# Patient Record
Sex: Female | Born: 1990 | Race: White | Hispanic: Yes | Marital: Married | Smoking: Former smoker
Health system: Southern US, Community
[De-identification: ages and names within clinical notes are randomized; demographics above are authoritative.]

## PROBLEM LIST (undated history)

## (undated) DIAGNOSIS — G8929 Other chronic pain: Secondary | ICD-10-CM

## (undated) DIAGNOSIS — F32A Depression, unspecified: Secondary | ICD-10-CM

## (undated) DIAGNOSIS — F419 Anxiety disorder, unspecified: Secondary | ICD-10-CM

## (undated) HISTORY — DX: Depression, unspecified: F32.A

## (undated) HISTORY — DX: Anxiety disorder, unspecified: F41.9

## (undated) HISTORY — PX: FOOT SURGERY: SHX648

## (undated) HISTORY — DX: Other chronic pain: G89.29

## (undated) HISTORY — PX: WISDOM TOOTH EXTRACTION: SHX21

---

## 2017-04-05 DIAGNOSIS — G8921 Chronic pain due to trauma: Secondary | ICD-10-CM | POA: Insufficient documentation

## 2021-03-02 NOTE — L&D Delivery Note (Signed)
OB/GYN Faculty Practice Delivery Note  Brittany Hunter is a 31 y.o. G2P2002 s/p SVD at [redacted]w[redacted]d. She was admitted for eIOL.   ROM: 0h 78m with clear fluid GBS Status: Positive - adequately treated with PCN then ampicillin Maximum Maternal Temperature: 98.1  Labor Progress: Pt came in for eIOL but was found to be in active labor. Progressed without augmentation to SROM then quickly began uncontrollably pushing.  Delivery Date/Time: 12/25/21 at 1508 Delivery: Called to room and patient was complete and pushing. Head delivered ROA with turtle sign. Nuchal cord present. Mom moved into McRobert's position, attempted to grasp the anterior axilla but was unsuccessful. Successfully grasped the posterior axilla and rotated baby out from under the pubic bone and was able to delivery the shoulder and body. Nuchal cord reduced and baby dried and stimulated. Baby still with weak cry and blue in color so NICU team called, but then baby began crying vigorously. HR and color improved, NICU able to exit. Cord clamped x 2 after 3-minute delay, and cut by FOB. Cord blood drawn. Placenta delivered spontaneously, intact, with 3-vessel cord. Fundus firm with massage and Pitocin. Labia, perineum, vagina, and cervix inspected, hemostatic perineal abrasion laceration found but no repair needed.   Placenta: Spontaneous, intact -> sent to L&D for disposal Complications: 92E shoulder dystocia Lacerations: Perineal abrasion EBL: 45ml Analgesia: None  Postpartum Planning [x]  transfer orders to MB [x]  discharge summary started & shared [x]  message to sent to schedule follow-up  [x]  lists updated [x]  vaccines UTD  Infant: Boy(no)  APGARs 5/9  [place], CNM, [place] Certified Nurse Midwife, [facility] for [employer], [facility] 12/25/2021, 4:55 PM

## 2021-03-20 LAB — CYTOLOGY - PAP: Pap: NEGATIVE

## 2021-05-05 ENCOUNTER — Other Ambulatory Visit: Payer: Self-pay

## 2021-05-05 ENCOUNTER — Ambulatory Visit (INDEPENDENT_AMBULATORY_CARE_PROVIDER_SITE_OTHER): Payer: No Typology Code available for payment source | Admitting: Advanced Practice Midwife

## 2021-05-05 VITALS — BP 131/80 | HR 91 | Wt 189.2 lb

## 2021-05-05 DIAGNOSIS — Z3201 Encounter for pregnancy test, result positive: Secondary | ICD-10-CM | POA: Diagnosis not present

## 2021-05-05 LAB — POCT PREGNANCY, URINE: Preg Test, Ur: POSITIVE — AB

## 2021-05-05 NOTE — Patient Instructions (Addendum)
[facility] Prenatal Care Providers          [facility] locations:  Hours may vary. Please call for an appointment  [facility] at [facility]             [address], [place], [place] [address] [phone]  [facility] at [facility]                                                             [address], [address], [place], [place], [address] [phone]  [facility] at [facility]                                    [address], [address], , [place], [address] [phone]  [facility] at [place] [address], [address], [place], [place], [address] [phone]  [facility] at [facility]                                 [address], [place], [place], [address] [phone]  [facility]                                    [address], [place], [place], [address] [phone]  [facility] at [address], [address], [place], [place], [address]                                                   Safe Medications in Pregnancy    Acne: Benzoyl Peroxide Salicylic Acid  Backache/Headache: Tylenol: 2 regular strength every 4 hours OR              2 Extra strength every 6 hours  Colds/Coughs/Allergies: Benadryl (alcohol free) 25 mg every 6 hours as needed Breath right strips Claritin Cepacol throat lozenges Chloraseptic throat spray Cold-Eeze- up to three times per day Cough drops, alcohol free Flonase (by prescription only) Guaifenesin Mucinex Robitussin DM (plain only, alcohol free) Saline nasal spray/drops Sudafed (pseudoephedrine) & Actifed ** use only after [redacted] weeks gestation and if you do not have high blood pressure Tylenol Vicks Vaporub Zinc lozenges Zyrtec   Constipation: Colace Ducolax suppositories Fleet enema Glycerin  suppositories Metamucil Milk of magnesia Miralax Senokot Smooth move tea  Diarrhea: Kaopectate Imodium A-D  *NO pepto Bismol  Hemorrhoids: Anusol Anusol HC Preparation H Tucks  Indigestion: Tums Maalox Mylanta Zantac  Pepcid  Insomnia: Benadryl (alcohol free) 25mg every 6 hours as needed Tylenol PM Unisom, no Gelcaps  Leg Cramps: Tums MagGel  Nausea/Vomiting:  Bonine Dramamine Emetrol Ginger extract Sea bands Meclizine  Nausea medication to take during pregnancy:  Unisom (doxylamine succinate 25 mg tablets) Take one tablet daily at bedtime. If symptoms are not adequately controlled, the dose can be increased to a maximum recommended dose of two   tablets daily (1/2 tablet in the morning, 1/2 tablet mid-afternoon and one at bedtime). Vitamin B6 100mg tablets. Take one tablet twice a day (up to 200 mg per day).  Skin Rashes: Aveeno products Benadryl cream or 25mg every 6 hours as needed Calamine Lotion 1% cortisone cream  Yeast infection: Gyne-lotrimin 7 Monistat 7   **If taking multiple medications, please check labels to avoid duplicating the same active ingredients **take medication as directed on the label ** Do not exceed 4000 mg of tylenol in 24 hours **Do not take medications that contain aspirin or ibuprofen    

## 2021-05-05 NOTE — Progress Notes (Signed)
Patient here today for pregnancy test. Urine pregnancy test positive. Patient denies any vaginal bleeding. Patient denies any pain aside from a right sided pain she gets in her abdomen when she does her daily stretches in the morning. Per patient this pain only occurs when stretching. Per patient her LMP is 03/20/21, making her 4w4dwith an EDD of 12/25/21. Patient states she does not have an established OB/GYN as she is new to the area. Patient requested to receive prenatal care here. New OB appointment scheduled. I discussed the Mom + Baby Combined Care program and the Centering Pregnancy program with patient. Patient says she would like to think about both program options before making a decision. Patient denies any other concerns or questions.  ? ? ?MPaulina Fusi RN ?05/05/21 ? ?ATTESTATION OF SUPERVISION OF RN: Evaluation and management procedures were performed by the RN under my supervision and collaboration. I have reviewed the nursing note and chart and agree with the management and plan for this patient. ? ?--I met with patient to confirm positive pregnancy test. Reviewed first trimester precautions ?--Patient intends to pursue prenatal care with [facility] ? ?SMallie Snooks MSA, MSN, CNM ?Certified Nurse Midwife, [facility]? ? ?

## 2021-06-03 ENCOUNTER — Telehealth (INDEPENDENT_AMBULATORY_CARE_PROVIDER_SITE_OTHER): Payer: Self-pay

## 2021-06-03 ENCOUNTER — Other Ambulatory Visit (HOSPITAL_COMMUNITY)
Admission: RE | Admit: 2021-06-03 | Discharge: 2021-06-03 | Disposition: A | Discharge status: Home / Self Care | Payer: No Typology Code available for payment source | Source: Ambulatory Visit | Attending: Family Medicine | Admitting: Family Medicine

## 2021-06-03 DIAGNOSIS — Z3480 Encounter for supervision of other normal pregnancy, unspecified trimester: Secondary | ICD-10-CM

## 2021-06-03 DIAGNOSIS — Z3A00 Weeks of gestation of pregnancy not specified: Secondary | ICD-10-CM

## 2021-06-03 DIAGNOSIS — Z3490 Encounter for supervision of normal pregnancy, unspecified, unspecified trimester: Secondary | ICD-10-CM | POA: Insufficient documentation

## 2021-06-03 MED ORDER — VITAMIN B-6 100 MG PO TABS: 100.0000 mg | ORAL_TABLET | Freq: Every day | ORAL | 3 refills | Status: DC

## 2021-06-03 MED ORDER — UNISOM SLEEPTABS 25 MG PO TABS: 25.0000 mg | ORAL_TABLET | Freq: Every evening | ORAL | 3 refills | Status: DC | PRN

## 2021-06-03 NOTE — Progress Notes (Signed)
Pt states in her left hip she has a torn labrum. She also advised that she stopped Depression meds once she found out that she was pregnant. ?

## 2021-06-03 NOTE — Progress Notes (Signed)
New OB Intake ? ?I connected with  Brittany Hunter on 06/03/21 at 11:15 AM EDT by In person Video Visit and verified that I am speaking with the correct person using two identifiers. Nurse is located at [facility] and pt is located at [facility]. ? ?I discussed the limitations, risks, security and privacy concerns of performing an evaluation and management service by telephone and the availability of in person appointments. I also discussed with the patient that there may be a patient responsible charge related to this service. The patient expressed understanding and agreed to proceed. ? ?I explained I am completing New OB Intake today. We discussed her EDD of 12/25/21 that is based on LMP of 03/20/21. Pt is G2/P0. I reviewed her allergies, medications, Medical/Surgical/OB history, and appropriate screenings. I informed her of [facility] services. Based on history, this is a/an  pregnancy uncomplicated .  ? ?Patient Active Problem List  ? Diagnosis Date Noted  ? Supervision of other normal pregnancy, antepartum 06/03/2021  ? ? ?Concerns addressed today ? ?Delivery Plans:  ?Plans to deliver at [facility].  ? ?MyChart/Babyscripts ?MyChart access verified. I explained pt will have some visits in office and some virtually. Babyscripts instructions given and order placed. Patient verifies receipt of registration text/e-mail. Account successfully created and app downloaded. ? ?Blood Pressure Cuff  ?Patient is self-pay; explained patient will be given BP cuff at first prenatal appt. Explained after first prenatal appt pt will check weekly and document in Babyscripts. ? ?Weight scale: Patient does / does not  have weight scale. Weight scale ordered for patient to pick up from [facility].  ? ?Anatomy US ?Explained first scheduled [place] will be around 19 weeks. Anatomy US scheduled for 07/31/21 at 0845. Pt notified to arrive at 0830. ?Scheduled AFP lab only appointment if CenteringPregnancy pt for same day as anatomy US.   ? ?Labs ?Discussed Avelina Laine genetic screening with patient. Would like both Panorama and Horizon drawn at new OB visit.Also if interested in genetic testing, tell patient she will need AFP 15-21 weeks to complete genetic testing .Routine prenatal labs needed. ? ?Covid Vaccine ?Patient has covid vaccine.  ? ?Is patient a CenteringPregnancy candidate? Not a candidate  "Centering Patient" indicated on sticky note ?  ?Is patient a Mom+Baby Combined Care candidate? Not a candidate   Scheduled with Mom+Baby provider  ?  ?Is patient interested in [facility]? No  "Interested in [employer] - Schedule next visit with CNM" on sticky note ? ?Informed patient of [facility] website  and placed link in her AVS.  ? ?Social Determinants of Health ?Food Insecurity: Patient denies food insecurity. ?WIC Referral: Patient is interested in referral to [facility].  ?Transportation: Patient denies transportation needs. ?Childcare: Discussed no children allowed at ultrasound appointments. Offered childcare services; patient declines childcare services at this time. ? ?Send link to Pregnancy Navigators ? ? ?Placed OB Box on problem list and updated ? ?First visit review ?I reviewed new OB appt with pt. I explained she will have a pelvic exam, ob bloodwork with genetic screening, and PAP smear. Explained pt will be seen by Dr. Shawnie Pons ? ? ? at first visit; encounter routed to appropriate provider. Explained that patient will be seen by pregnancy navigator following visit with provider. [facility] information placed in AVS.  ? ?Henrietta Dine, CMA ?06/03/2021  11:34 AM  ?

## 2021-06-03 NOTE — Patient Instructions (Signed)

## 2021-06-04 LAB — GC/CHLAMYDIA PROBE AMP (~~LOC~~) NOT AT ARMC
Chlamydia: NEGATIVE
Comment: NEGATIVE
Comment: NORMAL
Neisseria Gonorrhea: NEGATIVE

## 2021-06-04 LAB — CBC/D/PLT+RPR+RH+ABO+RUBIGG...
Antibody Screen: NEGATIVE
Basophils Absolute: 0 10*3/uL (ref 0.0–0.2)
Basos: 0 %
EOS (ABSOLUTE): 0.1 10*3/uL (ref 0.0–0.4)
Eos: 2 %
HCV Ab: NONREACTIVE
HIV Screen 4th Generation wRfx: NONREACTIVE
Hematocrit: 33.9 % — ABNORMAL LOW (ref 34.0–46.6)
Hemoglobin: 11.6 g/dL (ref 11.1–15.9)
Hepatitis B Surface Ag: NEGATIVE
Immature Grans (Abs): 0 10*3/uL (ref 0.0–0.1)
Immature Granulocytes: 0 %
Lymphocytes Absolute: 0.7 10*3/uL (ref 0.7–3.1)
Lymphs: 10 %
MCH: 30.9 pg (ref 26.6–33.0)
MCHC: 34.2 g/dL (ref 31.5–35.7)
MCV: 90 fL (ref 79–97)
Monocytes Absolute: 0.3 10*3/uL (ref 0.1–0.9)
Monocytes: 4 %
Neutrophils Absolute: 5.8 10*3/uL (ref 1.4–7.0)
Neutrophils: 84 %
Platelets: 185 10*3/uL (ref 150–450)
RBC: 3.76 x10E6/uL — ABNORMAL LOW (ref 3.77–5.28)
RDW: 13 % (ref 11.7–15.4)
RPR Ser Ql: NONREACTIVE
Rh Factor: POSITIVE
Rubella Antibodies, IGG: 2.51 index (ref >0.99)
WBC: 6.9 10*3/uL (ref 3.4–10.8)

## 2021-06-04 LAB — HEMOGLOBIN A1C
Est. average glucose Bld gHb Est-mCnc: 100 mg/dL
Hgb A1c MFr Bld: 5.1 % (ref 4.8–5.6)

## 2021-06-04 LAB — HCV INTERPRETATION

## 2021-06-05 LAB — URINE CULTURE, OB REFLEX

## 2021-06-05 LAB — CULTURE, OB URINE

## 2021-06-11 ENCOUNTER — Encounter: Payer: Self-pay | Admitting: Family Medicine

## 2021-06-12 ENCOUNTER — Ambulatory Visit (INDEPENDENT_AMBULATORY_CARE_PROVIDER_SITE_OTHER): Payer: Non-veteran care | Admitting: Family Medicine

## 2021-06-12 ENCOUNTER — Encounter: Payer: Self-pay | Admitting: Family Medicine

## 2021-06-12 ENCOUNTER — Encounter: Payer: Self-pay | Admitting: *Deleted

## 2021-06-12 VITALS — BP 104/65 | HR 70 | Wt 193.5 lb

## 2021-06-12 DIAGNOSIS — Z3480 Encounter for supervision of other normal pregnancy, unspecified trimester: Secondary | ICD-10-CM

## 2021-06-12 DIAGNOSIS — F419 Anxiety disorder, unspecified: Secondary | ICD-10-CM | POA: Insufficient documentation

## 2021-06-12 DIAGNOSIS — F32A Depression, unspecified: Secondary | ICD-10-CM | POA: Insufficient documentation

## 2021-06-12 DIAGNOSIS — G8929 Other chronic pain: Secondary | ICD-10-CM | POA: Insufficient documentation

## 2021-06-12 NOTE — Patient Instructions (Signed)

## 2021-06-12 NOTE — Progress Notes (Signed)
?  ? ?Subjective:  ? ?Brittany Hunter is a 31 y.o. G2P1001 at [redacted]w[redacted]d by LMP being seen today for her first obstetrical visit.  Her obstetrical history is not signficant. Patient does intend to breast feed. Pregnancy history fully reviewed. ? ?Patient reports nausea and depressed mood . ? ?HISTORY: ?OB History  ?Gravida Para Term Preterm AB Living  ?2 1 1  0 0 1  ?SAB IAB Ectopic Multiple Live Births  ?0 0 0 0 1  ?  ?# Outcome Date GA Lbr Len/2nd Weight Sex Delivery Anes PTL Lv  ?2 Current           ?1 Term 02/09/17 [redacted]w[redacted]d  6 lb 8 oz (2.948 kg) M Vag-Spont None  LIV  ? Last pap smear was  2023 and was normal ?Past Medical History:  ?Diagnosis Date  ? Anxiety   ? Chronic back pain   ? Depression   ? ?Past Surgical History:  ?Procedure Laterality Date  ? FOOT SURGERY Right   ? WISDOM TOOTH EXTRACTION    ? ?Family History  ?Problem Relation Age of Onset  ? Hypertension Mother   ? Diabetes Mother   ? Hypertension Father   ? Diabetes Maternal Grandmother   ? Diabetes Paternal Grandmother   ? ?Social History  ? ?Tobacco Use  ? Smoking status: Former  ?  Types: Cigarettes  ? Smokeless tobacco: Never  ?Vaping Use  ? Vaping Use: Never used  ?Substance Use Topics  ? Alcohol use: Not Currently  ?  Comment: stopped Beer when found out pregnant  ? Drug use: Not Currently  ?  Types: Marijuana  ? ?No Known Allergies ?Current Outpatient Medications on File Prior to Visit  ?Medication Sig Dispense Refill  ? cetirizine (ZYRTEC) 10 MG tablet Take 10 mg by mouth daily.    ? Prenatal Vit-Fe Fumarate-FA (MULTIVITAMIN-PRENATAL) 27-0.8 MG TABS tablet Take 1 tablet by mouth daily at 12 noon.    ? doxylamine, Sleep, (UNISOM SLEEPTABS) 25 MG tablet Take 1 tablet (25 mg total) by mouth at bedtime as needed. (Patient not taking: Reported on 06/12/2021) 30 tablet 3  ? pyridOXINE (VITAMIN B-6) 100 MG tablet Take 1 tablet (100 mg total) by mouth daily. (Patient not taking: Reported on 06/12/2021) 30 tablet 3  ? ?No current facility-administered  medications on file prior to visit.  ? ? ? ?Exam  ? ?Vitals:  ? 06/12/21 0840  ?BP: 104/65  ?Pulse: 70  ?Weight: 193 lb 8 oz (87.8 kg)  ? ?Fetal Heart Rate (bpm): 166 ? ?System: General: well-developed, well-nourished female in no acute distress  ? Skin: normal coloration and turgor, no rashes  ? Neurologic: oriented, normal, negative, normal mood  ? Extremities: normal strength, tone, and muscle mass, ROM of all joints is normal  ? HEENT PERRLA, extraocular movement intact and sclera clear, anicteric  ? Mouth/Teeth mucous membranes moist, pharynx normal without lesions and dental hygiene good  ? Neck supple and no masses  ? Cardiovascular: regular rate and rhythm  ? Respiratory:  no respiratory distress, normal breath sounds  ? Abdomen: soft, non-tender; bowel sounds normal; no masses,  no organomegaly  ? ?  ?Assessment:  ? ?Pregnancy: G2P1001 ?Patient Active Problem List  ? Diagnosis Date Noted  ? Anxiety 06/12/2021  ? Depression 06/12/2021  ? Chronic back pain 06/12/2021  ? Supervision of other normal pregnancy, antepartum 06/03/2021  ? ?  ?Plan:  ?1. Anxiety ?Has therapist ?Discussed medication use at length. ?On Cymbalta prior to pregnancy--declines meds for  now ? ?2. Supervision of other normal pregnancy, antepartum ?New OB labs reviewed and WNL ?Genetics pending ?Desires Centering ? ? ?Initial labs drawn. ?Continue prenatal vitamins. ?Genetic Screening discussed, NIPS: ordered. ?Ultrasound discussed; fetal anatomic survey: ordered. ?Problem list reviewed and updated. ?The nature of  - [facility] Faculty Practice with multiple MDs and other Advanced Practice Providers was explained to patient; also emphasized that residents, students are part of our team. ?Routine obstetric precautions reviewed. ?Return in 4 weeks (on 07/10/2021). ? ?  ? ?

## 2021-06-13 ENCOUNTER — Telehealth: Payer: Self-pay

## 2021-06-13 NOTE — Telephone Encounter (Signed)
Called Pt to remind of Centering appt on 06/18/21 @ 9 am, Pt states will be there. ?

## 2021-06-18 ENCOUNTER — Ambulatory Visit (INDEPENDENT_AMBULATORY_CARE_PROVIDER_SITE_OTHER): Payer: Non-veteran care | Admitting: Certified Nurse Midwife

## 2021-06-18 VITALS — BP 115/55 | HR 78 | Wt 195.8 lb

## 2021-06-18 DIAGNOSIS — Z3A12 12 weeks gestation of pregnancy: Secondary | ICD-10-CM

## 2021-06-18 DIAGNOSIS — Z3491 Encounter for supervision of normal pregnancy, unspecified, first trimester: Secondary | ICD-10-CM

## 2021-06-18 NOTE — Progress Notes (Signed)
? ?  PRENATAL VISIT NOTE ? ?Subjective:  ?Brittany Hunter is a 31 y.o. G2P1001 at [redacted]w[redacted]d being seen today for ongoing prenatal care.  She is currently monitored for the following issues for this low-risk pregnancy and has Supervision of other normal pregnancy, antepartum; Anxiety; Depression; and Chronic back pain on their problem list. ? ?Patient reports no complaints.  Contractions: Not present.  .  Movement: Present. Denies leaking of fluid.  ? ?The following portions of the patient's history were reviewed and updated as appropriate: allergies, current medications, past family history, past medical history, past social history, past surgical history and problem list.  ? ?Objective:  ? ?Vitals:  ? 06/18/21 0855  ?BP: (!) 115/55  ?Pulse: 78  ?Weight: 195 lb 12.8 oz (88.8 kg)  ? ? ?Fetal Status: Fetal Heart Rate (bpm): 161   Movement: Present    ? ?General:  Alert, oriented and cooperative. Patient is in no acute distress.  ?Skin: Skin is warm and dry. No rash noted.   ?Cardiovascular: Normal heart rate noted  ?Respiratory: Normal respiratory effort, no problems with respiration noted  ?Abdomen: Soft, gravid, appropriate for gestational age.  Pain/Pressure: Absent     ?Pelvic: Cervical exam deferred        ?Extremities: Normal range of motion.  Edema: None  ?Mental Status: Normal mood and affect. Normal behavior. Normal judgment and thought content.  ? ?Assessment and Plan:  ?Pregnancy: G2P1001 at [redacted]w[redacted]d ?1. Supervision of low-risk pregnancy, first trimester ?- Doing well overall, starting to feel flutters of fetal movement ? ?2. [redacted] weeks gestation of pregnancy ?- Routine OB care  ? ? ?Centering Pregnancy, Session#1: Introduction to model of care. Group determined rules for self-governance and closing phrase. Oriented group to space and mother's notebook.  ? ?Facilitated discussion today:  reviewed confidentiality, photo release, timing of visits, common discomforts, when to call practice. Closed with Centering 3  Breaths. ? ?Preterm labor symptoms and general obstetric precautions including but not limited to vaginal bleeding, contractions, leaking of fluid and fetal movement were reviewed in detail with the patient. ?Please refer to After Visit Summary for other counseling recommendations.  ? ?Patient to continue group care. ? ?Future Appointments  ?Date Time Provider [place]  ?07/16/2021  9:00 AM CENTERING PROVIDER [facility] [place]  ?07/31/2021  8:30 AM [facility] NURSE [facility]  ?07/31/2021  8:45 AM [facility] US5 [facility]  ?08/13/2021  9:00 AM CENTERING PROVIDER [facility] [place]  ?09/10/2021  9:00 AM CENTERING PROVIDER [facility] [place]  ?09/24/2021  9:00 AM CENTERING PROVIDER [facility] [place]  ?10/08/2021  9:00 AM CENTERING PROVIDER [facility] [place]  ?10/22/2021  9:00 AM CENTERING PROVIDER [facility] [place]  ?11/05/2021  9:00 AM CENTERING PROVIDER [facility] [place]  ?11/19/2021  9:00 AM CENTERING PROVIDER [facility]  ?12/03/2021  9:00 AM CENTERING PROVIDER [facility] [place]  ? ? ?Gabriel Carina, CNM ?

## 2021-06-18 NOTE — Patient Instructions (Signed)

## 2021-07-10 ENCOUNTER — Encounter: Payer: Non-veteran care | Admitting: Family Medicine

## 2021-07-16 ENCOUNTER — Ambulatory Visit (INDEPENDENT_AMBULATORY_CARE_PROVIDER_SITE_OTHER): Payer: Non-veteran care | Admitting: Certified Nurse Midwife

## 2021-07-16 ENCOUNTER — Ambulatory Visit: Payer: Non-veteran care | Admitting: Certified Nurse Midwife

## 2021-07-16 VITALS — BP 115/58 | HR 84 | Wt 202.0 lb

## 2021-07-16 DIAGNOSIS — M545 Low back pain, unspecified: Secondary | ICD-10-CM

## 2021-07-16 DIAGNOSIS — Z3A16 16 weeks gestation of pregnancy: Secondary | ICD-10-CM

## 2021-07-16 DIAGNOSIS — Z3480 Encounter for supervision of other normal pregnancy, unspecified trimester: Secondary | ICD-10-CM

## 2021-07-16 DIAGNOSIS — Z3492 Encounter for supervision of normal pregnancy, unspecified, second trimester: Secondary | ICD-10-CM

## 2021-07-16 DIAGNOSIS — G8929 Other chronic pain: Secondary | ICD-10-CM

## 2021-07-16 NOTE — Progress Notes (Signed)
Duplicate visit - AFP ordered here prior to visit being closed ?

## 2021-07-16 NOTE — Progress Notes (Signed)
? ?  PRENATAL VISIT NOTE ? ?Subjective:  ?Brittany Hunter is a 31 y.o. G2P1001 at [redacted]w[redacted]d being seen today for ongoing prenatal care.  She is currently monitored for the following issues for this low-risk pregnancy and has Supervision of other normal pregnancy, antepartum; Anxiety; Depression; and Chronic back pain on their problem list. ? ?Patient reports no complaints.  Contractions: Not present.  .  Movement: Present. Denies leaking of fluid.  ? ?The following portions of the patient's history were reviewed and updated as appropriate: allergies, current medications, past family history, past medical history, past social history, past surgical history and problem list.  ? ?Objective:  ? ?Vitals:  ? 07/16/21 1233  ?BP: (!) 115/58  ?Pulse: 84  ?Weight: 202 lb (91.6 kg)  ? ? ?Fetal Status: Fetal Heart Rate (bpm): 135   Movement: Present    ? ?General:  Alert, oriented and cooperative. Patient is in no acute distress.  ?Skin: Skin is warm and dry. No rash noted.   ?Cardiovascular: Normal heart rate noted  ?Respiratory: Normal respiratory effort, no problems with respiration noted  ?Abdomen: Soft, gravid, appropriate for gestational age.  Pain/Pressure: Absent     ?Pelvic: Cervical exam deferred        ?Extremities: Normal range of motion.  Edema: None  ?Mental Status: Normal mood and affect. Normal behavior. Normal judgment and thought content.  ? ?Assessment and Plan:  ?Pregnancy: G2P1001 at [redacted]w[redacted]d ?1. Supervision of low-risk pregnancy, second trimester ?- Doing well, feeling regular and vigorous fetal movement  ? ?2. [redacted] weeks gestation of pregnancy ?- Routine OB care including AFP ? ?3. Chronic bilateral low back pain without sciatica ?- Doing well with regular PT ? ? ?Centering Pregnancy, Session#2: Reviewed rules for self-governance with group. Direct group to [employer].  ? ?Facilitated discussion today:  Nutrition, Back pain, Genetic screening options with AFP/Quad.  ? ?Mindfulness activity completed as  well as deep breathing with 1 minutes of tension and 1 minute of relaxation for childbirth preparation.  Also participated in mindful eating activity ?Activity-demonstrated exercises to alleviate back and round ligament pain. ? ?Preterm labor symptoms and general obstetric precautions including but not limited to vaginal bleeding, contractions, leaking of fluid and fetal movement were reviewed in detail with the patient. ?Please refer to After Visit Summary for other counseling recommendations.  ? ?Return in about 4 weeks (around 08/13/2021) for CENTERING. ? ?Future Appointments  ?Date Time Provider [place]  ?07/31/2021  8:30 AM [facility] NURSE [facility]  ?07/31/2021  8:45 AM [facility] US5 [facility]  ?08/13/2021  9:00 AM CENTERING PROVIDER [facility] [place]  ?09/10/2021  9:00 AM CENTERING PROVIDER [facility] [place]  ?09/24/2021  9:00 AM CENTERING PROVIDER [facility] [place]  ?10/08/2021  9:00 AM CENTERING PROVIDER [facility] [place]  ?10/22/2021  9:00 AM CENTERING PROVIDER [facility] [place]  ?11/05/2021  9:00 AM CENTERING PROVIDER [facility] [place]  ?11/19/2021  9:00 AM CENTERING PROVIDER [facility]  ?12/03/2021  9:00 AM CENTERING PROVIDER [facility] [place]  ? ? ?Gabriel Carina, CNM ?

## 2021-07-18 LAB — AFP, SERUM, OPEN SPINA BIFIDA
AFP MoM: 1.21
AFP Value: 39.1 ng/mL
Gest. Age on Collection Date: 16.9 weeks
Maternal Age At EDD: 29.8 yr
OSBR Risk 1 IN: 6301
Test Results:: NEGATIVE
Weight: 202 [lb_av]

## 2021-07-31 ENCOUNTER — Other Ambulatory Visit: Payer: Self-pay | Admitting: *Deleted

## 2021-07-31 ENCOUNTER — Ambulatory Visit: Payer: No Typology Code available for payment source | Attending: Family Medicine

## 2021-07-31 ENCOUNTER — Ambulatory Visit: Payer: No Typology Code available for payment source | Admitting: *Deleted

## 2021-07-31 ENCOUNTER — Encounter: Payer: Self-pay | Admitting: *Deleted

## 2021-07-31 DIAGNOSIS — Z3480 Encounter for supervision of other normal pregnancy, unspecified trimester: Secondary | ICD-10-CM

## 2021-07-31 DIAGNOSIS — O99212 Obesity complicating pregnancy, second trimester: Secondary | ICD-10-CM | POA: Diagnosis not present

## 2021-07-31 DIAGNOSIS — Z363 Encounter for antenatal screening for malformations: Secondary | ICD-10-CM | POA: Diagnosis not present

## 2021-07-31 DIAGNOSIS — Z3A19 19 weeks gestation of pregnancy: Secondary | ICD-10-CM | POA: Insufficient documentation

## 2021-07-31 DIAGNOSIS — O321XX0 Maternal care for breech presentation, not applicable or unspecified: Secondary | ICD-10-CM | POA: Insufficient documentation

## 2021-07-31 DIAGNOSIS — Z362 Encounter for other antenatal screening follow-up: Secondary | ICD-10-CM

## 2021-08-12 ENCOUNTER — Telehealth: Payer: Self-pay

## 2021-08-12 NOTE — Telephone Encounter (Signed)
Called Pt to remind of Centering tomorrow at 9a, she states that she will be there.

## 2021-08-12 NOTE — Progress Notes (Unsigned)
   PRENATAL VISIT NOTE  Subjective:  Brittany Hunter is a 31 y.o. G2P1001 at [redacted]w[redacted]d being seen today for ongoing prenatal care.  She is currently monitored for the following issues for this {Blank single:19197::"high-risk","low-risk"} pregnancy and has Supervision of other normal pregnancy, antepartum; Anxiety; Depression; and Chronic back pain on their problem list.  Patient reports {sx:14538}.   .  .   . Denies leaking of fluid.   The following portions of the patient's history were reviewed and updated as appropriate: allergies, current medications, past family history, past medical history, past social history, past surgical history and problem list.   Objective:  There were no vitals filed for this visit.  Fetal Status:           General:  Alert, oriented and cooperative. Patient is in no acute distress.  Skin: Skin is warm and dry. No rash noted.   Cardiovascular: Normal heart rate noted  Respiratory: Normal respiratory effort, no problems with respiration noted  Abdomen: Soft, gravid, appropriate for gestational age.        Pelvic: {Blank single:19197::"Cervical exam performed in the presence of a chaperone","Cervical exam deferred"}        Extremities: Normal range of motion.     Mental Status: Normal mood and affect. Normal behavior. Normal judgment and thought content.   Assessment and Plan:  Pregnancy: G2P1001 at [redacted]w[redacted]d 1. Encounter for supervision of low-risk pregnancy in second trimester ***  2. [redacted] weeks gestation of pregnancy ***   Centering Pregnancy, Session#3: Reviewed resources in [employer].   Facilitated discussion today:  Stress/Stress reduction and breastfeeding/infant nutrition Mindfulness activity completed as well as deep breathing with still touch for childbirth preparation.    Fundal height and FHR appropriate today unless noted otherwise in plan. Patient to continue group care.   {Blank single:19197::"Term","Preterm"} labor symptoms and  general obstetric precautions including but not limited to vaginal bleeding, contractions, leaking of fluid and fetal movement were reviewed in detail with the patient. Please refer to After Visit Summary for other counseling recommendations.   No follow-ups on file.  Future Appointments  Date Time Provider [place]  08/13/2021  9:00 AM Gabriel Carina, CNM [facility]  08/26/2021  9:15 AM [facility] NURSE [facility]  08/26/2021  9:30 AM [facility] US2 [facility]  09/10/2021  9:00 AM CENTERING PROVIDER [facility]  09/24/2021  9:00 AM CENTERING PROVIDER [facility]  10/08/2021  9:00 AM CENTERING PROVIDER [facility]  10/22/2021  9:00 AM CENTERING PROVIDER [facility]  11/05/2021  9:00 AM CENTERING PROVIDER [facility]  11/19/2021  9:00 AM CENTERING PROVIDER [facility]  12/03/2021  9:00 AM CENTERING PROVIDER [facility] Mora Kalil Woessner, CNM

## 2021-08-13 ENCOUNTER — Ambulatory Visit (INDEPENDENT_AMBULATORY_CARE_PROVIDER_SITE_OTHER): Payer: No Typology Code available for payment source | Admitting: Certified Nurse Midwife

## 2021-08-13 VITALS — BP 123/84 | HR 60 | Wt 209.0 lb

## 2021-08-13 DIAGNOSIS — Z3A20 20 weeks gestation of pregnancy: Secondary | ICD-10-CM

## 2021-08-13 DIAGNOSIS — S73192S Other sprain of left hip, sequela: Secondary | ICD-10-CM

## 2021-08-13 DIAGNOSIS — Z3492 Encounter for supervision of normal pregnancy, unspecified, second trimester: Secondary | ICD-10-CM

## 2021-08-13 MED ORDER — CYCLOBENZAPRINE HCL 10 MG PO TABS: 10.0000 mg | ORAL_TABLET | Freq: Three times a day (TID) | ORAL | 1 refills | Status: DC | PRN

## 2021-08-26 ENCOUNTER — Encounter: Payer: Self-pay | Admitting: *Deleted

## 2021-08-26 ENCOUNTER — Ambulatory Visit: Payer: No Typology Code available for payment source | Admitting: *Deleted

## 2021-08-26 ENCOUNTER — Ambulatory Visit: Payer: No Typology Code available for payment source | Attending: Obstetrics

## 2021-08-26 ENCOUNTER — Ambulatory Visit: Payer: No Typology Code available for payment source | Attending: Obstetrics and Gynecology

## 2021-08-26 ENCOUNTER — Ambulatory Visit (HOSPITAL_BASED_OUTPATIENT_CLINIC_OR_DEPARTMENT_OTHER): Payer: No Typology Code available for payment source | Admitting: Obstetrics and Gynecology

## 2021-08-26 ENCOUNTER — Other Ambulatory Visit: Payer: Self-pay | Admitting: *Deleted

## 2021-08-26 VITALS — BP 108/53 | HR 82

## 2021-08-26 DIAGNOSIS — Z3A22 22 weeks gestation of pregnancy: Secondary | ICD-10-CM | POA: Diagnosis not present

## 2021-08-26 DIAGNOSIS — O99212 Obesity complicating pregnancy, second trimester: Secondary | ICD-10-CM

## 2021-08-26 DIAGNOSIS — Z3480 Encounter for supervision of other normal pregnancy, unspecified trimester: Secondary | ICD-10-CM | POA: Insufficient documentation

## 2021-08-26 DIAGNOSIS — O3509X0 Maternal care for (suspected) other central nervous system malformation or damage in fetus, not applicable or unspecified: Secondary | ICD-10-CM

## 2021-08-26 DIAGNOSIS — Z363 Encounter for antenatal screening for malformations: Secondary | ICD-10-CM | POA: Diagnosis present

## 2021-08-26 DIAGNOSIS — E669 Obesity, unspecified: Secondary | ICD-10-CM

## 2021-08-26 DIAGNOSIS — Z362 Encounter for other antenatal screening follow-up: Secondary | ICD-10-CM | POA: Insufficient documentation

## 2021-08-26 DIAGNOSIS — O358XX0 Maternal care for other (suspected) fetal abnormality and damage, not applicable or unspecified: Secondary | ICD-10-CM | POA: Insufficient documentation

## 2021-08-26 DIAGNOSIS — O3500X0 Maternal care for (suspected) central nervous system malformation or damage in fetus, unspecified, not applicable or unspecified: Secondary | ICD-10-CM

## 2021-08-26 DIAGNOSIS — Z6835 Body mass index (BMI) 35.0-35.9, adult: Secondary | ICD-10-CM

## 2021-08-27 LAB — CMV IGM: CMV IgM Ser EIA-aCnc: <30 AU/mL (ref 0.0–29.9)

## 2021-08-27 LAB — CMV ANTIBODY, IGG (EIA): CMV Ab - IgG: 6.6 U/mL — ABNORMAL HIGH (ref 0.00–0.59)

## 2021-08-27 LAB — INFECT DISEASE AB IGM REFLEX 1

## 2021-08-27 LAB — TOXOPLASMA GONDII ANTIBODY, IGM: Toxoplasma Antibody- IgM: <3 AU/mL (ref 0.0–7.9)

## 2021-08-27 LAB — TOXOPLASMA GONDII ANTIBODY, IGG: Toxoplasma IgG Ratio: <3 IU/mL (ref 0.0–7.1)

## 2021-08-28 ENCOUNTER — Telehealth: Payer: Self-pay | Admitting: *Deleted

## 2021-09-10 ENCOUNTER — Ambulatory Visit (INDEPENDENT_AMBULATORY_CARE_PROVIDER_SITE_OTHER): Payer: Non-veteran care | Admitting: Certified Nurse Midwife

## 2021-09-10 VITALS — BP 124/61 | HR 84 | Wt 215.8 lb

## 2021-09-10 DIAGNOSIS — Z3492 Encounter for supervision of normal pregnancy, unspecified, second trimester: Secondary | ICD-10-CM

## 2021-09-10 DIAGNOSIS — R4586 Emotional lability: Secondary | ICD-10-CM

## 2021-09-10 DIAGNOSIS — Z3A24 24 weeks gestation of pregnancy: Secondary | ICD-10-CM

## 2021-09-10 NOTE — Progress Notes (Signed)
   PRENATAL VISIT NOTE  Subjective:  Brittany Hunter is a 31 y.o. G2P1001 at [redacted]w[redacted]d being seen today for ongoing prenatal care.  She is currently monitored for the following issues for this low-risk pregnancy and has Supervision of other normal pregnancy, antepartum; Anxiety; Depression; and Chronic back pain on their problem list.  Patient reports  no physical complaints, has noticed she is having some emotional lability lately but nothing severe .  Contractions: Not present. Vag. Bleeding: None.  Movement: Present. Denies leaking of fluid.   The following portions of the patient's history were reviewed and updated as appropriate: allergies, current medications, past family history, past medical history, past social history, past surgical history and problem list.   Objective:   Vitals:   09/10/21 0853  BP: 124/61  Pulse: 84  Weight: 215 lb 12.8 oz (97.9 kg)   Fetal Status: Fetal Heart Rate (bpm): 142 Fundal Height: 23 cm Movement: Present     General:  Alert, oriented and cooperative. Patient is in no acute distress.  Skin: Skin is warm and dry. No rash noted.   Cardiovascular: Normal heart rate noted  Respiratory: Normal respiratory effort, no problems with respiration noted  Abdomen: Soft, gravid, appropriate for gestational age.  Pain/Pressure: Present     Pelvic: Cervical exam deferred        Extremities: Normal range of motion.  Edema: Trace  Mental Status: Normal mood and affect. Normal behavior. Normal judgment and thought content.   Assessment and Plan:  Pregnancy: G2P1001 at [redacted]w[redacted]d 1. Encounter for supervision of low-risk pregnancy in second trimester - Doing well, feeling regular and vigorous fetal movement   2. [redacted] weeks gestation of pregnancy - Routine OB care including anticipatory guidance re GTT at next visit  3. Emotional lability - Discussed ways to handle emotional lability and encouraged use of a homeopathic remedy to help flares of anxiety (Rescue  Remedy)   Centering Pregnancy, Session#4: Reviewed resources in [employer].   Facilitated discussion today:  Family dynamics/environment, family planning postpartum, and intimate partner violence. Empowerment activity completed as well. Patient to continue group care.   Preterm labor symptoms and general obstetric precautions including but not limited to vaginal bleeding, contractions, leaking of fluid and fetal movement were reviewed in detail with the patient. Please refer to After Visit Summary for other counseling recommendations.   Return in about 2 weeks (around 09/24/2021) for CENTERING, IN-PERSON w/ GTT.  Future Appointments  Date Time Provider Department Center  09/23/2021 10:30 AM [facility] NURSE [facility]  09/23/2021 10:45 AM [facility] US6 [facility]  09/24/2021  8:40 AM [facility]-WOCA LAB [facility]  09/24/2021  9:00 AM CENTERING PROVIDER [facility]  10/08/2021  9:00 AM CENTERING PROVIDER [facility]  10/22/2021  9:00 AM CENTERING PROVIDER [facility]  11/05/2021  9:00 AM CENTERING PROVIDER [facility]  11/19/2021  9:00 AM CENTERING PROVIDER [facility]  12/03/2021  9:00 AM CENTERING PROVIDER [facility]    Bernerd Limbo, CNM

## 2021-09-23 ENCOUNTER — Other Ambulatory Visit: Payer: Self-pay

## 2021-09-23 ENCOUNTER — Ambulatory Visit: Payer: No Typology Code available for payment source | Admitting: *Deleted

## 2021-09-23 ENCOUNTER — Ambulatory Visit: Payer: No Typology Code available for payment source | Attending: Obstetrics and Gynecology

## 2021-09-23 ENCOUNTER — Other Ambulatory Visit: Payer: Self-pay | Admitting: *Deleted

## 2021-09-23 VITALS — BP 114/67 | HR 82

## 2021-09-23 DIAGNOSIS — O321XX0 Maternal care for breech presentation, not applicable or unspecified: Secondary | ICD-10-CM | POA: Insufficient documentation

## 2021-09-23 DIAGNOSIS — O99212 Obesity complicating pregnancy, second trimester: Secondary | ICD-10-CM | POA: Insufficient documentation

## 2021-09-23 DIAGNOSIS — O3509X0 Maternal care for (suspected) other central nervous system malformation or damage in fetus, not applicable or unspecified: Secondary | ICD-10-CM | POA: Insufficient documentation

## 2021-09-23 DIAGNOSIS — O358XX0 Maternal care for other (suspected) fetal abnormality and damage, not applicable or unspecified: Secondary | ICD-10-CM | POA: Insufficient documentation

## 2021-09-23 DIAGNOSIS — Z3A26 26 weeks gestation of pregnancy: Secondary | ICD-10-CM | POA: Insufficient documentation

## 2021-09-23 DIAGNOSIS — Z6835 Body mass index (BMI) 35.0-35.9, adult: Secondary | ICD-10-CM | POA: Insufficient documentation

## 2021-09-23 DIAGNOSIS — Z3480 Encounter for supervision of other normal pregnancy, unspecified trimester: Secondary | ICD-10-CM | POA: Insufficient documentation

## 2021-09-23 DIAGNOSIS — E669 Obesity, unspecified: Secondary | ICD-10-CM | POA: Diagnosis not present

## 2021-09-24 ENCOUNTER — Ambulatory Visit (INDEPENDENT_AMBULATORY_CARE_PROVIDER_SITE_OTHER): Payer: Non-veteran care | Admitting: Certified Nurse Midwife

## 2021-09-24 ENCOUNTER — Other Ambulatory Visit: Payer: No Typology Code available for payment source

## 2021-09-24 ENCOUNTER — Other Ambulatory Visit: Payer: Self-pay

## 2021-09-24 VITALS — BP 111/65 | HR 80 | Wt 218.4 lb

## 2021-09-24 DIAGNOSIS — R4586 Emotional lability: Secondary | ICD-10-CM

## 2021-09-24 DIAGNOSIS — F5104 Psychophysiologic insomnia: Secondary | ICD-10-CM

## 2021-09-24 DIAGNOSIS — Z3A26 26 weeks gestation of pregnancy: Secondary | ICD-10-CM

## 2021-09-24 DIAGNOSIS — Z3480 Encounter for supervision of other normal pregnancy, unspecified trimester: Secondary | ICD-10-CM

## 2021-09-24 DIAGNOSIS — Z3492 Encounter for supervision of normal pregnancy, unspecified, second trimester: Secondary | ICD-10-CM

## 2021-09-24 MED ORDER — MAGNESIUM OXIDE -MG SUPPLEMENT 200 MG PO TABS: 400.0000 mg | ORAL_TABLET | Freq: Every day | ORAL | 3 refills | Status: DC

## 2021-09-25 LAB — CBC
Hematocrit: 34.7 % (ref 34.0–46.6)
Hemoglobin: 11.7 g/dL (ref 11.1–15.9)
MCH: 30.7 pg (ref 26.6–33.0)
MCHC: 33.7 g/dL (ref 31.5–35.7)
MCV: 91 fL (ref 79–97)
Platelets: 179 10*3/uL (ref 150–450)
RBC: 3.81 x10E6/uL (ref 3.77–5.28)
RDW: 12.7 % (ref 11.7–15.4)
WBC: 9.7 10*3/uL (ref 3.4–10.8)

## 2021-09-25 LAB — GLUCOSE TOLERANCE, 2 HOURS W/ 1HR
Glucose, 1 hour: 150 mg/dL (ref 70–179)
Glucose, 2 hour: 111 mg/dL (ref 70–152)
Glucose, Fasting: 72 mg/dL (ref 70–91)

## 2021-09-25 LAB — RPR: RPR Ser Ql: NONREACTIVE

## 2021-09-25 LAB — HIV ANTIBODY (ROUTINE TESTING W REFLEX): HIV Screen 4th Generation wRfx: NONREACTIVE

## 2021-09-25 NOTE — Progress Notes (Signed)
   PRENATAL VISIT NOTE  Subjective:  Brittany Hunter is a 31 y.o. G2P1001 at [redacted]w[redacted]d being seen today for ongoing prenatal care.  She is currently monitored for the following issues for this low-risk pregnancy and has Supervision of other normal pregnancy, antepartum; Anxiety; Depression; and Chronic back pain on their problem list.  Patient reports  some difficulty sleeping but otherwise doing well .  Contractions: Irritability. Vag. Bleeding: None.  Movement: Present. Denies leaking of fluid.   The following portions of the patient's history were reviewed and updated as appropriate: allergies, current medications, past family history, past medical history, past social history, past surgical history and problem list.   Objective:   Vitals:   09/24/21 0941  BP: 111/65  Pulse: 80  Weight: 218 lb 6.4 oz (99.1 kg)    Fetal Status:     Movement: Present     General:  Alert, oriented and cooperative. Patient is in no acute distress.  Skin: Skin is warm and dry. No rash noted.   Cardiovascular: Normal heart rate noted  Respiratory: Normal respiratory effort, no problems with respiration noted  Abdomen: Soft, gravid, appropriate for gestational age.  Pain/Pressure: Present     Pelvic: Cervical exam deferred        Extremities: Normal range of motion.  Edema: Trace  Mental Status: Normal mood and affect. Normal behavior. Normal judgment and thought content.   Assessment and Plan:  Pregnancy: G2P1001 at [redacted]w[redacted]d 1. Encounter for supervision of low-risk pregnancy in second trimester - Doing well, feeling regular and vigorous fetal movement   2. [redacted] weeks gestation of pregnancy - Routine OB caren including GTT and 3rd trimester labs  3. Emotional lability - still present but she is tolerating the pregnancy well and has a solid support system  4. Psychophysiological insomnia - Magnesium Oxide -Mg Supplement (MAG-OXIDE) 200 MG TABS; Take 2 tablets (400 mg total) by mouth at bedtime. If that  amount causes loose stools in the am, switch to 200mg  daily at bedtime.  Dispense: 60 tablet; Refill: 3   Centering Pregnancy, Session#5: Reviewed resources in .   Facilitated discussion today:  Stages of Labor, Sign of labor, coping strategies and deep relaxation breathing.  Patient to continue group prenatal care.  Preterm labor symptoms and general obstetric precautions including but not limited to vaginal bleeding, contractions, leaking of fluid and fetal movement were reviewed in detail with the patient. Please refer to After Visit Summary for other counseling recommendations.   Return in about 2 weeks (around 10/08/2021) for IN-PERSON, CENTERING.  Future Appointments  Date Time Provider Department Center  10/08/2021  9:00 AM CENTERING PROVIDER [facility]  10/22/2021  7:45 AM [facility] NURSE [facility]  10/22/2021  8:00 AM [facility]MFC US1 [facility]  10/22/2021  9:00 AM CENTERING PROVIDER [facility]  11/05/2021  9:00 AM CENTERING PROVIDER [facility]  11/19/2021  9:00 AM CENTERING PROVIDER [facility]  12/03/2021  9:00 AM CENTERING PROVIDER [facility]    02/02/2022, CNM

## 2021-10-08 ENCOUNTER — Telehealth: Payer: Self-pay

## 2021-10-08 NOTE — Telephone Encounter (Signed)
Called Pt to find out why she missed Centering for today, no answer, left VM OF NEXT MEETING ON 10/22/21.

## 2021-10-22 ENCOUNTER — Ambulatory Visit (INDEPENDENT_AMBULATORY_CARE_PROVIDER_SITE_OTHER): Payer: Non-veteran care | Admitting: Certified Nurse Midwife

## 2021-10-22 ENCOUNTER — Ambulatory Visit: Payer: No Typology Code available for payment source | Admitting: *Deleted

## 2021-10-22 ENCOUNTER — Other Ambulatory Visit: Payer: Self-pay | Admitting: *Deleted

## 2021-10-22 ENCOUNTER — Ambulatory Visit: Payer: No Typology Code available for payment source | Attending: Obstetrics

## 2021-10-22 VITALS — BP 123/76 | HR 84 | Wt 222.4 lb

## 2021-10-22 VITALS — BP 110/66 | HR 89

## 2021-10-22 DIAGNOSIS — O3509X0 Maternal care for (suspected) other central nervous system malformation or damage in fetus, not applicable or unspecified: Secondary | ICD-10-CM | POA: Diagnosis present

## 2021-10-22 DIAGNOSIS — Z3689 Encounter for other specified antenatal screening: Secondary | ICD-10-CM

## 2021-10-22 DIAGNOSIS — O99213 Obesity complicating pregnancy, third trimester: Secondary | ICD-10-CM | POA: Diagnosis not present

## 2021-10-22 DIAGNOSIS — O3500X0 Maternal care for (suspected) central nervous system malformation or damage in fetus, unspecified, not applicable or unspecified: Secondary | ICD-10-CM

## 2021-10-22 DIAGNOSIS — E669 Obesity, unspecified: Secondary | ICD-10-CM

## 2021-10-22 DIAGNOSIS — Z3A30 30 weeks gestation of pregnancy: Secondary | ICD-10-CM

## 2021-10-22 DIAGNOSIS — Z3493 Encounter for supervision of normal pregnancy, unspecified, third trimester: Secondary | ICD-10-CM

## 2021-10-22 DIAGNOSIS — M545 Low back pain, unspecified: Secondary | ICD-10-CM

## 2021-10-22 DIAGNOSIS — F419 Anxiety disorder, unspecified: Secondary | ICD-10-CM

## 2021-10-22 DIAGNOSIS — G8929 Other chronic pain: Secondary | ICD-10-CM

## 2021-10-22 NOTE — Progress Notes (Signed)
   PRENATAL VISIT NOTE  Subjective:  Brittany Hunter is a 31 y.o. G2P1001 at [redacted]w[redacted]d being seen today for ongoing prenatal care.  She is currently monitored for the following issues for this low-risk pregnancy and has Supervision of low-risk pregnancy; Anxiety; Depression; and Chronic back pain on their problem list.  Patient reports no complaints.  Contractions: Not present. Vag. Bleeding: None.  Movement: Present. Denies leaking of fluid.   The following portions of the patient's history were reviewed and updated as appropriate: allergies, current medications, past family history, past medical history, past social history, past surgical history and problem list.   Objective:   Vitals:   10/22/21 0855  BP: 123/76  Pulse: 84  Weight: 222 lb 6.4 oz (100.9 kg)    Fetal Status: Fetal Heart Rate (bpm): 140 Fundal Height: 30 cm Movement: Present     General:  Alert, oriented and cooperative. Patient is in no acute distress.  Skin: Skin is warm and dry. No rash noted.   Cardiovascular: Normal heart rate noted  Respiratory: Normal respiratory effort, no problems with respiration noted  Abdomen: Soft, gravid, appropriate for gestational age.  Pain/Pressure: Absent     Pelvic: Cervical exam deferred        Extremities: Normal range of motion.  Edema: Trace  Mental Status: Normal mood and affect. Normal behavior. Normal judgment and thought content.   Assessment and Plan:  Pregnancy: G2P1001 at [redacted]w[redacted]d 1. Encounter for supervision of low-risk pregnancy in third trimester - Doing well, feeling regular and vigorous fetal movement   2. [redacted] weeks gestation of pregnancy - Routine OB care   3. Anxiety - Well managed with therapy  4. Chronic bilateral low back pain without sciatica - Handling it well with stretches and physical therapy tricks  Preterm labor symptoms and general obstetric precautions including but not limited to vaginal bleeding, contractions, leaking of fluid and fetal  movement were reviewed in detail with the patient. Please refer to After Visit Summary for other counseling recommendations.   Return in about 2 weeks (around 11/05/2021) for IN-PERSON, CENTERING.  Future Appointments  Date Time Provider Department Center  11/05/2021  9:00 AM CENTERING PROVIDER [facility]  11/19/2021  9:00 AM CENTERING PROVIDER [facility]  11/24/2021  8:30 AM [facility] NURSE [facility]  11/24/2021  8:45 AM [facility] US4 [facility]  12/03/2021  9:00 AM CENTERING PROVIDER [facility]  12/10/2021  8:35 AM Bernerd Limbo, CNM [facility]  12/17/2021  8:35 AM Bernerd Limbo, CNM [facility]  12/24/2021  8:35 AM Bernerd Limbo, CNM [facility]    Bernerd Limbo, CNM

## 2021-11-05 ENCOUNTER — Ambulatory Visit (INDEPENDENT_AMBULATORY_CARE_PROVIDER_SITE_OTHER): Payer: No Typology Code available for payment source | Admitting: Certified Nurse Midwife

## 2021-11-05 VITALS — BP 112/71 | HR 93 | Wt 227.8 lb

## 2021-11-05 DIAGNOSIS — M549 Dorsalgia, unspecified: Secondary | ICD-10-CM

## 2021-11-05 DIAGNOSIS — O0993 Supervision of high risk pregnancy, unspecified, third trimester: Secondary | ICD-10-CM

## 2021-11-05 DIAGNOSIS — Z3A32 32 weeks gestation of pregnancy: Secondary | ICD-10-CM

## 2021-11-05 DIAGNOSIS — O99891 Other specified diseases and conditions complicating pregnancy: Secondary | ICD-10-CM

## 2021-11-05 NOTE — Progress Notes (Signed)
Pt reports problems getting to sleep & staying asleep.

## 2021-11-05 NOTE — Progress Notes (Signed)
   PRENATAL VISIT NOTE  Subjective:  Brittany Hunter is a 31 y.o. G2P1001 at [redacted]w[redacted]d being seen today for ongoing prenatal care.  She is currently monitored for the following issues for this low-risk pregnancy and has Supervision of low-risk pregnancy; Anxiety; Depression; and Chronic back pain on their problem list.  Patient reports no complaints.  Contractions: Irritability. Vag. Bleeding: None.  Movement: Present. Denies leaking of fluid.   The following portions of the patient's history were reviewed and updated as appropriate: allergies, current medications, past family history, past medical history, past social history, past surgical history and problem list.   Objective:   Vitals:   11/05/21 0904  BP: 112/71  Pulse: 93  Weight: 227 lb 12.8 oz (103.3 kg)    Fetal Status: Fetal Heart Rate (bpm): 140 Fundal Height: 33 cm Movement: Present  Presentation: Vertex  General:  Alert, oriented and cooperative. Patient is in no acute distress.  Skin: Skin is warm and dry. No rash noted.   Cardiovascular: Normal heart rate noted  Respiratory: Normal respiratory effort, no problems with respiration noted  Abdomen: Soft, gravid, appropriate for gestational age.  Pain/Pressure: Present     Pelvic: Cervical exam deferred        Extremities: Normal range of motion.  Edema: Trace  Mental Status: Normal mood and affect. Normal behavior. Normal judgment and thought content.   Assessment and Plan:  Pregnancy: G2P1001 at [redacted]w[redacted]d 1. Supervision of high risk pregnancy in third trimester - Doing well, feeling regular and vigorous fetal movement   2. [redacted] weeks gestation of pregnancy - Routine OB care   3. Back pain affecting pregnancy in third trimester - Doing well with PT, discussed use of pregnancy belt or rebozo    Centering Pregnancy, Session#8: Reviewed resources in [employer]. Facilitated discussion today:  - newborn cues, development and calming - mindfulness and relaxation  technique  Patient to continue group care.   Preterm labor symptoms and general obstetric precautions including but not limited to vaginal bleeding, contractions, leaking of fluid and fetal movement were reviewed in detail with the patient. Please refer to After Visit Summary for other counseling recommendations.   Return in about 2 weeks (around 11/19/2021) for IN-PERSON, CENTERING.  Future Appointments  Date Time Provider Department Center  11/19/2021  9:00 AM CENTERING PROVIDER [facility]  11/24/2021  8:30 AM [facility] NURSE [facility]  11/24/2021  8:45 AM [facility] US4 [facility]  12/03/2021  9:00 AM CENTERING PROVIDER [facility]  12/10/2021  8:35 AM Bernerd Limbo, CNM [facility]  12/17/2021  8:35 AM Bernerd Limbo, CNM [facility]  12/24/2021  8:35 AM Bernerd Limbo, CNM [facility]    Bernerd Limbo, CNM

## 2021-11-19 ENCOUNTER — Ambulatory Visit (INDEPENDENT_AMBULATORY_CARE_PROVIDER_SITE_OTHER): Payer: No Typology Code available for payment source | Admitting: Certified Nurse Midwife

## 2021-11-19 ENCOUNTER — Other Ambulatory Visit (HOSPITAL_COMMUNITY)
Admission: RE | Admit: 2021-11-19 | Discharge: 2021-11-19 | Disposition: A | Discharge status: Home / Self Care | Payer: No Typology Code available for payment source | Source: Ambulatory Visit | Attending: Certified Nurse Midwife | Admitting: Certified Nurse Midwife

## 2021-11-19 VITALS — BP 105/64 | HR 90 | Wt 231.8 lb

## 2021-11-19 DIAGNOSIS — N898 Other specified noninflammatory disorders of vagina: Secondary | ICD-10-CM

## 2021-11-19 DIAGNOSIS — O26893 Other specified pregnancy related conditions, third trimester: Secondary | ICD-10-CM

## 2021-11-19 DIAGNOSIS — Z3493 Encounter for supervision of normal pregnancy, unspecified, third trimester: Secondary | ICD-10-CM | POA: Insufficient documentation

## 2021-11-19 DIAGNOSIS — Z3A35 35 weeks gestation of pregnancy: Secondary | ICD-10-CM

## 2021-11-19 DIAGNOSIS — Z3A34 34 weeks gestation of pregnancy: Secondary | ICD-10-CM

## 2021-11-19 NOTE — Progress Notes (Unsigned)
Pt reports a lot of lower abdominal pressure with Mucousy dishcharge.

## 2021-11-20 LAB — CERVICOVAGINAL ANCILLARY ONLY
Bacterial Vaginitis (gardnerella): NEGATIVE
Candida Glabrata: NEGATIVE
Candida Vaginitis: NEGATIVE
Chlamydia: NEGATIVE
Comment: NEGATIVE
Comment: NEGATIVE
Comment: NEGATIVE
Comment: NEGATIVE
Comment: NEGATIVE
Comment: NORMAL
Neisseria Gonorrhea: NEGATIVE
Trichomonas: NEGATIVE

## 2021-11-20 NOTE — Progress Notes (Signed)
   PRENATAL VISIT NOTE  Subjective:  Brittany Hunter is a 31 y.o. G2P1001 at [redacted]w[redacted]d being seen today for ongoing prenatal care.  She is currently monitored for the following issues for this low-risk pregnancy and has Supervision of low-risk pregnancy; Anxiety; Depression; and Chronic back pain on their problem list.  Patient reports occasional contractions (1 daily for the last 3 days) and increased egg-white like discharge.  Contractions: Irritability. Vag. Bleeding: None.  Movement: Present. Denies leaking of fluid.   The following portions of the patient's history were reviewed and updated as appropriate: allergies, current medications, past family history, past medical history, past social history, past surgical history and problem list.   Objective:   Vitals:   11/19/21 0923  BP: 105/64  Pulse: 90  Weight: 231 lb 12.8 oz (105.1 kg)    Fetal Status: Fetal Heart Rate (bpm): 148 Fundal Height: 35 cm Movement: Present  Presentation: Vertex  General:  Alert, oriented and cooperative. Patient is in no acute distress.  Skin: Skin is warm and dry. No rash noted.   Cardiovascular: Normal heart rate noted  Respiratory: Normal respiratory effort, no problems with respiration noted  Abdomen: Soft, gravid, appropriate for gestational age.  Pain/Pressure: Present     Pelvic: Cervical exam deferred        Extremities: Normal range of motion.  Edema: None  Mental Status: Normal mood and affect. Normal behavior. Normal judgment and thought content.   Assessment and Plan:  Pregnancy: G2P1001 at [redacted]w[redacted]d 1. Encounter for supervision of low-risk pregnancy in third trimester - Doing well, feeling regular and vigorous fetal movement   2. [redacted] weeks gestation of pregnancy - Routine OB care including anticipatory guidance re GBS test at next visit  3. Vaginal discharge during pregnancy in third trimester - Cervicovaginal ancillary only( [place])   Centering Pregnancy, Session#9: Reviewed  resources in [employer].  Facilitated discussion today: what ifs of labor, Cesarean delivery if needed, pain relief options, and brainstorming about postpartum topics to review next time Patient to continue group care.   Preterm labor symptoms and general obstetric precautions including but not limited to vaginal bleeding, contractions, leaking of fluid and fetal movement were reviewed in detail with the patient. Please refer to After Visit Summary for other counseling recommendations.   Return in about 2 weeks (around 12/03/2021) for IN-PERSON, [place].  Future Appointments  Date Time Provider [place]  11/24/2021  8:30 AM [facility] NURSE [facility]  11/24/2021  8:45 AM [facility] US4 [facility]  12/03/2021  9:00 AM CENTERING PROVIDER [facility]  12/10/2021  8:35 AM Gabriel Carina, CNM [facility]  12/17/2021  8:35 AM Gabriel Carina, CNM [facility]  12/24/2021  8:35 AM Gabriel Carina, CNM [facility]    Gabriel Carina, CNM

## 2021-11-24 ENCOUNTER — Ambulatory Visit: Payer: No Typology Code available for payment source | Attending: Obstetrics

## 2021-11-24 ENCOUNTER — Ambulatory Visit: Payer: No Typology Code available for payment source | Admitting: *Deleted

## 2021-11-24 DIAGNOSIS — O99213 Obesity complicating pregnancy, third trimester: Secondary | ICD-10-CM | POA: Diagnosis not present

## 2021-11-24 DIAGNOSIS — Z3A34 34 weeks gestation of pregnancy: Secondary | ICD-10-CM | POA: Diagnosis not present

## 2021-11-24 DIAGNOSIS — O3509X0 Maternal care for (suspected) other central nervous system malformation or damage in fetus, not applicable or unspecified: Secondary | ICD-10-CM | POA: Insufficient documentation

## 2021-11-24 DIAGNOSIS — O3503X0 Maternal care for (suspected) central nervous system malformation or damage in fetus, choroid plexus cysts, not applicable or unspecified: Secondary | ICD-10-CM

## 2021-11-24 DIAGNOSIS — E669 Obesity, unspecified: Secondary | ICD-10-CM | POA: Diagnosis not present

## 2021-12-03 ENCOUNTER — Ambulatory Visit (INDEPENDENT_AMBULATORY_CARE_PROVIDER_SITE_OTHER): Payer: No Typology Code available for payment source | Admitting: Certified Nurse Midwife

## 2021-12-03 ENCOUNTER — Other Ambulatory Visit (HOSPITAL_COMMUNITY)
Admission: RE | Admit: 2021-12-03 | Discharge: 2021-12-03 | Disposition: A | Discharge status: Home / Self Care | Payer: No Typology Code available for payment source | Source: Ambulatory Visit | Attending: Certified Nurse Midwife | Admitting: Certified Nurse Midwife

## 2021-12-03 VITALS — BP 116/67 | HR 74 | Wt 238.4 lb

## 2021-12-03 DIAGNOSIS — O0993 Supervision of high risk pregnancy, unspecified, third trimester: Secondary | ICD-10-CM | POA: Insufficient documentation

## 2021-12-03 DIAGNOSIS — Z3493 Encounter for supervision of normal pregnancy, unspecified, third trimester: Secondary | ICD-10-CM

## 2021-12-03 DIAGNOSIS — Z3A36 36 weeks gestation of pregnancy: Secondary | ICD-10-CM

## 2021-12-03 NOTE — Patient Instructions (Signed)
Midwife Jam's Lactation Bars Preheat oven to 350 degrees and grease a 9x13 pan Combine your fats and sugars: - 1c salted butter - 1/2-3/4c peanut butter - 2 eggs - 2tbsp flaxmeal in 4tbsp water - 1tsp vanilla - 1c white sugar - 1c brown sugar Mix the following dry ingredients in a separate bowl: - 2c flour - 1tsp baking soda - 1tsp salt Slowly add the dry into the wet mix. Once well combined, stir in the following: - 3c rolled oats  - 1c (or more) chocolate chips  Bake for 45min.  Can sub almond butter and add 1tsp cinnamon and raisins or dried cranberries.   Postpartum Herbal Tea 1/2c calendula 1/4c lavender 1/2c chamomile 1/4c lemon balm 1/2c rose petals 1/4c comfrey  1/2c epsom salt [place] bag or cheesecloth tied off  Put all herbs in the bag/cheesecloth and let soak in a pot of hot water for at least 20min.  Unwrap regular pads but do not take off the wrapper.  Spoon tea on to regular pads until they are soaked and refold the pads so they can go in the fridge.  Use frozen pads on your perineum for as long as desired.  Run a bath (no warmer than 102 degrees) and add the leftover tea and epsom salt. Have a nice bath with your baby, the herbs are very antibacterial so it's ok if their belly button gets wet just try not to submerge for long.  

## 2021-12-03 NOTE — Progress Notes (Signed)
   PRENATAL VISIT NOTE  Subjective:  Brittany Hunter is a 31 y.o. G2P1001 at [redacted]w[redacted]d being seen today for ongoing prenatal care.  She is currently monitored for the following issues for this low-risk pregnancy and has Supervision of low-risk pregnancy; Anxiety; Depression; and Chronic back pain on their problem list.  Patient reports occasional contractions.  Contractions: Irritability. Vag. Bleeding: None.  Movement: Present. Denies leaking of fluid.   The following portions of the patient's history were reviewed and updated as appropriate: allergies, current medications, past family history, past medical history, past social history, past surgical history and problem list.   Objective:   Vitals:   12/03/21 0900  BP: 116/67  Pulse: 74  Weight: 238 lb 6.4 oz (108.1 kg)    Fetal Status: Fetal Heart Rate (bpm): 150 Fundal Height: 36 cm Movement: Present  Presentation: Vertex  General:  Alert, oriented and cooperative. Patient is in no acute distress.  Skin: Skin is warm and dry. No rash noted.   Cardiovascular: Normal heart rate noted  Respiratory: Normal respiratory effort, no problems with respiration noted  Abdomen: Soft, gravid, appropriate for gestational age.  Pain/Pressure: Present     Pelvic: Cervical exam deferred        Extremities: Normal range of motion.  Edema: Trace  Mental Status: Normal mood and affect. Normal behavior. Normal judgment and thought content.   Assessment and Plan:  Pregnancy: G2P1001 at [redacted]w[redacted]d 1. Encounter for supervision of low-risk pregnancy in third trimester - Doing well, feeling regular and vigorous fetal movement   2. [redacted] weeks gestation of pregnancy - Routine OB care  - GC/Chlamydia probe amp ([place])not at [facility] - Culture, beta strep (group b only)   Centering Pregnancy, Session#10: Reviewed resources in [employer].  Facilitated discussion today: latching, normal newborn weight loss/jaundice, newborn behavioral cues and calming,  postpartum planning Patient to transition to traditional care until postpartum reunion visit.  Preterm labor symptoms and general obstetric precautions including but not limited to vaginal bleeding, contractions, leaking of fluid and fetal movement were reviewed in detail with the patient. Please refer to After Visit Summary for other counseling recommendations.   Return in about 1 week (around 12/10/2021) for IN-PERSON, LOB.  Future Appointments  Date Time Provider [place]  12/10/2021  8:35 AM Helaine Chess [facility]  12/17/2021  8:35 AM Gabriel Carina, CNM [facility]  12/24/2021  8:35 AM Gabriel Carina, CNM [facility]    Gabriel Carina, CNM

## 2021-12-04 LAB — GC/CHLAMYDIA PROBE AMP (~~LOC~~) NOT AT ARMC
Chlamydia: NEGATIVE
Comment: NEGATIVE
Comment: NORMAL
Neisseria Gonorrhea: NEGATIVE

## 2021-12-06 LAB — CULTURE, BETA STREP (GROUP B ONLY): Strep Gp B Culture: POSITIVE — AB

## 2021-12-08 ENCOUNTER — Encounter: Payer: Self-pay | Admitting: Certified Nurse Midwife

## 2021-12-10 ENCOUNTER — Other Ambulatory Visit: Payer: Self-pay

## 2021-12-10 ENCOUNTER — Ambulatory Visit (INDEPENDENT_AMBULATORY_CARE_PROVIDER_SITE_OTHER): Payer: No Typology Code available for payment source | Admitting: Certified Nurse Midwife

## 2021-12-10 VITALS — BP 112/72 | HR 77 | Wt 241.0 lb

## 2021-12-10 DIAGNOSIS — Z22330 Carrier of Group B streptococcus: Secondary | ICD-10-CM

## 2021-12-10 DIAGNOSIS — Z3A37 37 weeks gestation of pregnancy: Secondary | ICD-10-CM

## 2021-12-10 DIAGNOSIS — Z3493 Encounter for supervision of normal pregnancy, unspecified, third trimester: Secondary | ICD-10-CM

## 2021-12-11 NOTE — Progress Notes (Signed)
   PRENATAL VISIT NOTE  Subjective:  Brittany Hunter is a 31 y.o. G2P1001 at [redacted]w[redacted]d being seen today for ongoing prenatal care.  She is currently monitored for the following issues for this low-risk pregnancy and has Supervision of low-risk pregnancy; Anxiety; Depression; and Chronic back pain on their problem list.  Patient reports occasional contractions.  Contractions: Irritability. Vag. Bleeding: None.  Movement: Present. Denies leaking of fluid.   The following portions of the patient's history were reviewed and updated as appropriate: allergies, current medications, past family history, past medical history, past social history, past surgical history and problem list.   Objective:   Vitals:   12/10/21 0830  BP: 112/72  Pulse: 77  Weight: 241 lb (109.3 kg)   Fetal Status: Fetal Heart Rate (bpm): 138 Fundal Height: 38 cm Movement: Present  Presentation: Vertex  General:  Alert, oriented and cooperative. Patient is in no acute distress.  Skin: Skin is warm and dry. No rash noted.   Cardiovascular: Normal heart rate noted  Respiratory: Normal respiratory effort, no problems with respiration noted  Abdomen: Soft, gravid, appropriate for gestational age.  Pain/Pressure: Present     Pelvic: Cervical exam deferred        Extremities: Normal range of motion.  Edema: Trace  Mental Status: Normal mood and affect. Normal behavior. Normal judgment and thought content.   Assessment and Plan:  Pregnancy: G2P1001 at [redacted]w[redacted]d 1. Encounter for supervision of low-risk pregnancy in third trimester - Doing well, feeling regular and vigorous fetal movement   2. [redacted] weeks gestation of pregnancy - Routine OB care   3. GBS carrier - Will treat with PCN in labor  Term labor symptoms and general obstetric precautions including but not limited to vaginal bleeding, contractions, leaking of fluid and fetal movement were reviewed in detail with the patient. Please refer to After Visit Summary for other  counseling recommendations.   Return in about 1 week (around 12/17/2021) for IN-PERSON, LOB.  Future Appointments  Date Time Provider [place]  12/17/2021  8:35 AM Helaine Chess [facility]  12/24/2021  8:35 AM Gabriel Carina, CNM [facility]    Gabriel Carina, CNM

## 2021-12-17 ENCOUNTER — Ambulatory Visit (INDEPENDENT_AMBULATORY_CARE_PROVIDER_SITE_OTHER): Payer: No Typology Code available for payment source | Admitting: Certified Nurse Midwife

## 2021-12-17 ENCOUNTER — Other Ambulatory Visit: Payer: Self-pay

## 2021-12-17 VITALS — BP 113/74 | HR 88 | Wt 241.0 lb

## 2021-12-17 DIAGNOSIS — Z3493 Encounter for supervision of normal pregnancy, unspecified, third trimester: Secondary | ICD-10-CM

## 2021-12-17 DIAGNOSIS — Z3A38 38 weeks gestation of pregnancy: Secondary | ICD-10-CM

## 2021-12-17 NOTE — Progress Notes (Signed)
   PRENATAL VISIT NOTE  Subjective:  Brittany Hunter is a 31 y.o. G2P1001 at [redacted]w[redacted]d being seen today for ongoing prenatal care.  She is currently monitored for the following issues for this low-risk pregnancy and has Supervision of low-risk pregnancy; Anxiety; Depression; and Chronic back pain on their problem list.  Patient reports no complaints.  Contractions: Irritability. Vag. Bleeding: None.  Movement: Present. Denies leaking of fluid.   The following portions of the patient's history were reviewed and updated as appropriate: allergies, current medications, past family history, past medical history, past social history, past surgical history and problem list.   Objective:   Vitals:   12/17/21 0842  BP: 113/74  Pulse: 88  Weight: 241 lb (109.3 kg)    Fetal Status: Fetal Heart Rate (bpm): 136 Fundal Height: 39 cm Movement: Present  Presentation: Vertex  General:  Alert, oriented and cooperative. Patient is in no acute distress.  Skin: Skin is warm and dry. No rash noted.   Cardiovascular: Normal heart rate noted  Respiratory: Normal respiratory effort, no problems with respiration noted  Abdomen: Soft, gravid, appropriate for gestational age.  Pain/Pressure: Present     Pelvic: Cervical exam deferred        Extremities: Normal range of motion.  Edema: Trace  Mental Status: Normal mood and affect. Normal behavior. Normal judgment and thought content.   Assessment and Plan:  Pregnancy: G2P1001 at [redacted]w[redacted]d 1. Encounter for supervision of low-risk pregnancy in third trimester - Doing well, feeling regular and vigorous fetal movement   2. [redacted] weeks gestation of pregnancy - Routine OB care  - Discussed need for/timing of IOL, pt prefers to wait but may want one around 40wks. Will discuss at next visit.  Term labor symptoms and general obstetric precautions including but not limited to vaginal bleeding, contractions, leaking of fluid and fetal movement were reviewed in detail with  the patient. Please refer to After Visit Summary for other counseling recommendations.   Return in about 1 week (around 12/24/2021) for IN-PERSON, LOB.  Future Appointments  Date Time Provider [place]  12/24/2021  8:35 AM Gabriel Carina, CNM [facility]    Gabriel Carina, CNM

## 2021-12-24 ENCOUNTER — Other Ambulatory Visit: Payer: Self-pay

## 2021-12-24 ENCOUNTER — Other Ambulatory Visit: Payer: Self-pay | Admitting: Advanced Practice Midwife

## 2021-12-24 ENCOUNTER — Ambulatory Visit (INDEPENDENT_AMBULATORY_CARE_PROVIDER_SITE_OTHER): Payer: No Typology Code available for payment source | Admitting: Certified Nurse Midwife

## 2021-12-24 VITALS — BP 117/70 | HR 86 | Wt 243.4 lb

## 2021-12-24 DIAGNOSIS — Z3A39 39 weeks gestation of pregnancy: Secondary | ICD-10-CM

## 2021-12-24 DIAGNOSIS — Z3490 Encounter for supervision of normal pregnancy, unspecified, unspecified trimester: Secondary | ICD-10-CM

## 2021-12-24 DIAGNOSIS — Z3493 Encounter for supervision of normal pregnancy, unspecified, third trimester: Secondary | ICD-10-CM

## 2021-12-24 NOTE — Progress Notes (Signed)
   PRENATAL VISIT NOTE  Subjective:  Brittany Hunter is a 31 y.o. G2P1001 at [redacted]w[redacted]d being seen today for ongoing prenatal care.  She is currently monitored for the following issues for this low-risk pregnancy and has Supervision of low-risk pregnancy; Anxiety; Depression; and Chronic back pain on their problem list.  Patient reports occasional contractions.  Contractions: Irritability. Vag. Bleeding: None.  Movement: Present. Denies leaking of fluid.   The following portions of the patient's history were reviewed and updated as appropriate: allergies, current medications, past family history, past medical history, past social history, past surgical history and problem list.   Objective:   Vitals:   12/24/21 0856  BP: 117/70  Pulse: 86  Weight: 243 lb 6.4 oz (110.4 kg)    Fetal Status: Fetal Heart Rate (bpm): 154 Fundal Height: 40 cm Movement: Present  Presentation: Vertex  General:  Alert, oriented and cooperative. Patient is in no acute distress.  Skin: Skin is warm and dry. No rash noted.   Cardiovascular: Normal heart rate noted  Respiratory: Normal respiratory effort, no problems with respiration noted  Abdomen: Soft, gravid, appropriate for gestational age.  Pain/Pressure: Present     Pelvic: Cervical exam performed in the presence of a chaperone Dilation: 2.5 Effacement (%): 70 Station: -2  Extremities: Normal range of motion.  Edema: Trace  Mental Status: Normal mood and affect. Normal behavior. Normal judgment and thought content.   Assessment and Plan:  Pregnancy: G2P1001 at [redacted]w[redacted]d 1. Encounter for supervision of low-risk pregnancy in third trimester - Doing well, feeling regular and vigorous fetal movement  - Having intermittent contractions and may be in early labor, reviewed when to present to MAU  2. [redacted] weeks gestation of pregnancy - Routine OB care  - Pt requested term eIOL, scheduled and orders placed  Term labor symptoms and general obstetric precautions  including but not limited to vaginal bleeding, contractions, leaking of fluid and fetal movement were reviewed in detail with the patient. Please refer to After Visit Summary for other counseling recommendations.   Return in about 6 weeks (around 02/04/2022) for PP.  Future Appointments  Date Time Provider [place]  12/25/2021  7:45 AM [facility] [place] [facility] None  02/04/2022  8:35 AM Gabriel Carina, CNM [facility]   Gabriel Carina, CNM

## 2021-12-25 ENCOUNTER — Encounter (HOSPITAL_COMMUNITY): Payer: Self-pay | Admitting: Obstetrics and Gynecology

## 2021-12-25 ENCOUNTER — Inpatient Hospital Stay (HOSPITAL_COMMUNITY)
Admission: RE | Admit: 2021-12-25 | Discharge: 2021-12-26 | DRG: 807 | Disposition: A | Discharge status: Home / Self Care | Payer: No Typology Code available for payment source | Attending: Obstetrics and Gynecology | Admitting: Obstetrics and Gynecology

## 2021-12-25 ENCOUNTER — Other Ambulatory Visit: Payer: Self-pay

## 2021-12-25 ENCOUNTER — Inpatient Hospital Stay (HOSPITAL_COMMUNITY): Payer: No Typology Code available for payment source

## 2021-12-25 DIAGNOSIS — O99824 Streptococcus B carrier state complicating childbirth: Secondary | ICD-10-CM | POA: Diagnosis present

## 2021-12-25 DIAGNOSIS — Z3A40 40 weeks gestation of pregnancy: Secondary | ICD-10-CM

## 2021-12-25 DIAGNOSIS — O4202 Full-term premature rupture of membranes, onset of labor within 24 hours of rupture: Secondary | ICD-10-CM | POA: Diagnosis not present

## 2021-12-25 DIAGNOSIS — O99820 Streptococcus B carrier state complicating pregnancy: Secondary | ICD-10-CM | POA: Diagnosis not present

## 2021-12-25 DIAGNOSIS — O26893 Other specified pregnancy related conditions, third trimester: Secondary | ICD-10-CM | POA: Diagnosis present

## 2021-12-25 DIAGNOSIS — Z30017 Encounter for initial prescription of implantable subdermal contraceptive: Secondary | ICD-10-CM | POA: Diagnosis not present

## 2021-12-25 DIAGNOSIS — Z87891 Personal history of nicotine dependence: Secondary | ICD-10-CM | POA: Diagnosis not present

## 2021-12-25 DIAGNOSIS — Z23 Encounter for immunization: Secondary | ICD-10-CM | POA: Diagnosis not present

## 2021-12-25 DIAGNOSIS — Z3493 Encounter for supervision of normal pregnancy, unspecified, third trimester: Secondary | ICD-10-CM

## 2021-12-25 DIAGNOSIS — Z3490 Encounter for supervision of normal pregnancy, unspecified, unspecified trimester: Secondary | ICD-10-CM | POA: Diagnosis present

## 2021-12-25 DIAGNOSIS — O480 Post-term pregnancy: Secondary | ICD-10-CM | POA: Diagnosis not present

## 2021-12-25 LAB — CBC
HCT: 36.1 % (ref 36.0–46.0)
Hemoglobin: 12.3 g/dL (ref 12.0–15.0)
MCH: 30 pg (ref 26.0–34.0)
MCHC: 34.1 g/dL (ref 30.0–36.0)
MCV: 88 fL (ref 80.0–100.0)
Platelets: 132 10*3/uL — ABNORMAL LOW (ref 150–400)
RBC: 4.1 MIL/uL (ref 3.87–5.11)
RDW: 14.3 % (ref 11.5–15.5)
WBC: 8.8 10*3/uL (ref 4.0–10.5)
nRBC: 0 % (ref 0.0–0.2)

## 2021-12-25 LAB — TYPE AND SCREEN
ABO/RH(D): O POS
Antibody Screen: NEGATIVE

## 2021-12-25 LAB — RPR: RPR Ser Ql: NONREACTIVE

## 2021-12-25 MED ORDER — ONDANSETRON HCL 4 MG PO TABS: 4.0000 mg | ORAL_TABLET | ORAL | Status: DC | PRN

## 2021-12-25 MED ORDER — COCONUT OIL OIL: 1.0000 | TOPICAL_OIL | Status: DC | PRN

## 2021-12-25 MED ORDER — OXYCODONE-ACETAMINOPHEN 5-325 MG PO TABS: 1.0000 | ORAL_TABLET | ORAL | Status: DC | PRN

## 2021-12-25 MED ORDER — SODIUM CHLORIDE 0.9 % IV SOLN
2.0000 g | Freq: Once | INTRAVENOUS | Status: AC
  Administered 2021-12-25: 2 g via INTRAVENOUS
  Filled 2021-12-25: qty 2000

## 2021-12-25 MED ORDER — ONDANSETRON HCL 4 MG/2ML IJ SOLN: 4.0000 mg | INTRAMUSCULAR | Status: DC | PRN

## 2021-12-25 MED ORDER — PRENATAL MULTIVITAMIN CH
1.0000 | ORAL_TABLET | Freq: Every day | ORAL | Status: DC
  Administered 2021-12-26: 1 via ORAL
  Filled 2021-12-25: qty 1

## 2021-12-25 MED ORDER — PENICILLIN G POT IN DEXTROSE 60000 UNIT/ML IV SOLN: 3.0000 10*6.[IU] | INTRAVENOUS | Status: DC

## 2021-12-25 MED ORDER — SIMETHICONE 80 MG PO CHEW: 80.0000 mg | CHEWABLE_TABLET | ORAL | Status: DC | PRN

## 2021-12-25 MED ORDER — MISOPROSTOL 50MCG HALF TABLET: 50.0000 ug | ORAL_TABLET | Freq: Once | ORAL | Status: DC

## 2021-12-25 MED ORDER — SODIUM CHLORIDE 0.9 % IV SOLN
5.0000 10*6.[IU] | Freq: Once | INTRAVENOUS | Status: DC
  Administered 2021-12-25: 5 10*6.[IU] via INTRAVENOUS
  Filled 2021-12-25: qty 5

## 2021-12-25 MED ORDER — TETANUS-DIPHTH-ACELL PERTUSSIS 5-2.5-18.5 LF-MCG/0.5 IM SUSY
0.5000 mL | PREFILLED_SYRINGE | Freq: Once | INTRAMUSCULAR | Status: AC
  Administered 2021-12-26: 0.5 mL via INTRAMUSCULAR
  Filled 2021-12-25: qty 0.5

## 2021-12-25 MED ORDER — CEFAZOLIN SODIUM-DEXTROSE 1-4 GM/50ML-% IV SOLN
1.0000 g | Freq: Three times a day (TID) | INTRAVENOUS | Status: DC
  Filled 2021-12-25: qty 50

## 2021-12-25 MED ORDER — ONDANSETRON HCL 4 MG/2ML IJ SOLN: 4.0000 mg | Freq: Four times a day (QID) | INTRAMUSCULAR | Status: DC | PRN

## 2021-12-25 MED ORDER — OXYCODONE HCL 5 MG PO TABS: 5.0000 mg | ORAL_TABLET | ORAL | Status: DC | PRN

## 2021-12-25 MED ORDER — SENNOSIDES-DOCUSATE SODIUM 8.6-50 MG PO TABS
2.0000 | ORAL_TABLET | Freq: Every day | ORAL | Status: DC
  Administered 2021-12-26: 2 via ORAL
  Filled 2021-12-25: qty 2

## 2021-12-25 MED ORDER — CEFAZOLIN SODIUM-DEXTROSE 2-4 GM/100ML-% IV SOLN
2.0000 g | Freq: Once | INTRAVENOUS | Status: AC
  Administered 2021-12-25: 2 g via INTRAVENOUS
  Filled 2021-12-25: qty 100

## 2021-12-25 MED ORDER — BENZOCAINE-MENTHOL 20-0.5 % EX AERO
1.0000 | INHALATION_SPRAY | CUTANEOUS | Status: DC | PRN
  Administered 2021-12-25: 1 via TOPICAL
  Filled 2021-12-25: qty 56

## 2021-12-25 MED ORDER — INFLUENZA VAC SPLIT QUAD 0.5 ML IM SUSY
0.5000 mL | PREFILLED_SYRINGE | INTRAMUSCULAR | Status: AC
  Administered 2021-12-26: 0.5 mL via INTRAMUSCULAR
  Filled 2021-12-25: qty 0.5

## 2021-12-25 MED ORDER — DIPHENHYDRAMINE HCL 25 MG PO CAPS: 25.0000 mg | ORAL_CAPSULE | Freq: Four times a day (QID) | ORAL | Status: DC | PRN

## 2021-12-25 MED ORDER — FENTANYL CITRATE (PF) 100 MCG/2ML IJ SOLN
100.0000 ug | INTRAMUSCULAR | Status: DC | PRN
  Administered 2021-12-25: 100 ug via INTRAVENOUS
  Filled 2021-12-25: qty 2

## 2021-12-25 MED ORDER — ACETAMINOPHEN 325 MG PO TABS: 650.0000 mg | ORAL_TABLET | ORAL | Status: DC | PRN

## 2021-12-25 MED ORDER — LIDOCAINE HCL (PF) 1 % IJ SOLN: 30.0000 mL | INTRAMUSCULAR | Status: DC | PRN

## 2021-12-25 MED ORDER — OXYTOCIN-SODIUM CHLORIDE 30-0.9 UT/500ML-% IV SOLN
2.5000 [IU]/h | INTRAVENOUS | Status: DC
  Filled 2021-12-25: qty 500

## 2021-12-25 MED ORDER — MISOPROSTOL 25 MCG QUARTER TABLET: 25.0000 ug | ORAL_TABLET | Freq: Once | ORAL | Status: DC

## 2021-12-25 MED ORDER — TERBUTALINE SULFATE 1 MG/ML IJ SOLN: 0.2500 mg | Freq: Once | INTRAMUSCULAR | Status: DC | PRN

## 2021-12-25 MED ORDER — IBUPROFEN 600 MG PO TABS
600.0000 mg | ORAL_TABLET | Freq: Four times a day (QID) | ORAL | Status: DC
  Administered 2021-12-25 – 2021-12-26 (×4): 600 mg via ORAL
  Filled 2021-12-25 (×4): qty 1

## 2021-12-25 MED ORDER — OXYCODONE HCL 5 MG PO TABS: 10.0000 mg | ORAL_TABLET | ORAL | Status: DC | PRN

## 2021-12-25 MED ORDER — LACTATED RINGERS IV SOLN
500.0000 mL | INTRAVENOUS | Status: DC | PRN
  Administered 2021-12-25: 500 mL via INTRAVENOUS

## 2021-12-25 MED ORDER — LACTATED RINGERS IV SOLN: INTRAVENOUS | Status: DC

## 2021-12-25 MED ORDER — OXYCODONE-ACETAMINOPHEN 5-325 MG PO TABS: 2.0000 | ORAL_TABLET | ORAL | Status: DC | PRN

## 2021-12-25 MED ORDER — DIBUCAINE (PERIANAL) 1 % EX OINT: 1.0000 | TOPICAL_OINTMENT | CUTANEOUS | Status: DC | PRN

## 2021-12-25 MED ORDER — WITCH HAZEL-GLYCERIN EX PADS: 1.0000 | MEDICATED_PAD | CUTANEOUS | Status: DC | PRN

## 2021-12-25 MED ORDER — OXYTOCIN BOLUS FROM INFUSION
333.0000 mL | Freq: Once | INTRAVENOUS | Status: AC
  Administered 2021-12-25: 333 mL via INTRAVENOUS

## 2021-12-25 MED ORDER — SOD CITRATE-CITRIC ACID 500-334 MG/5ML PO SOLN: 30.0000 mL | ORAL | Status: DC | PRN

## 2021-12-25 NOTE — Discharge Summary (Cosign Needed Addendum)
Postpartum Discharge Summary     Patient Name: Brittany Hunter DOB: 07-17-1990 MRN: [id number]  Date of admission: 12/25/2021 Delivery date:12/25/2021  Delivering provider: Gaylan Gerold R  Date of discharge: 12/26/2021  Admitting diagnosis: Encounter for elective induction of labor [Z34.90] Intrauterine pregnancy: [redacted]w[redacted]d    Secondary diagnosis:  Principal Problem:   Vaginal delivery Active Problems:   Encounter for elective induction of labor  Additional problems: None    Discharge diagnosis: Term Pregnancy Delivered                                              Post partum procedures: None Augmentation: N/A Complications: None  Hospital course: Onset of Labor With Vaginal Delivery      31y.o. yo G2P2002 at 451w0das admitted in Active Labor on 12/25/2021. Labor course was uncomplicated.  Membrane Rupture Time/Date: 2:49 PM ,12/25/2021   Delivery Method:Vaginal, Spontaneous  Episiotomy: None  Lacerations:  None  Patient had a postpartum course was uncomplicated  She is ambulating, tolerating a regular diet, passing flatus, and urinating well. Patient is discharged home in stable condition on 12/26/21.  Newborn Data: Birth date:12/25/2021  Birth time:3:08 PM  Gender:Female  Living status:Living  Apgars:5 ,8  Weight:3680 g   Magnesium Sulfate received: No BMZ received: No Rhophylac:N/A MMR:N/A T-DaP:Given prenatally Flu: N/A Transfusion:No  Physical exam  Vitals:   12/25/21 1829 12/25/21 2019 12/25/21 2354 12/26/21 0600  BP: 111/65 115/66 120/67 118/62  Pulse: 78 71 68 62  Resp: 17 16 16 16   Temp: 98.3 F (36.8 C) 98 F (36.7 C) 98 F (36.7 C) 98 F (36.7 C)  TempSrc: Oral Oral Oral Oral  SpO2:    100%   General: alert, cooperative, and no distress Lochia: appropriate Uterine Fundus: firm Incision: N/A DVT Evaluation: No evidence of DVT seen on physical exam. No significant calf/ankle edema. Labs: Lab Results  Component Value Date   WBC 8.8  12/25/2021   HGB 12.3 12/25/2021   HCT 36.1 12/25/2021   MCV 88.0 12/25/2021   PLT 132 (L) 12/25/2021       No data to display         Edinburgh Score:    12/25/2021    6:30 PM  Edinburgh Postnatal Depression Scale Screening Tool  I have been able to laugh and see the funny side of things. 0  I have looked forward with enjoyment to things. 0  I have blamed myself unnecessarily when things went wrong. 0  I have been anxious or worried for no good reason. 0  I have felt scared or panicky for no good reason. 0  Things have been getting on top of me. 1  I have been so unhappy that I have had difficulty sleeping. 2  I have felt sad or miserable. 0  I have been so unhappy that I have been crying. 0  The thought of harming myself has occurred to me. 0  Edinburgh Postnatal Depression Scale Total 3     After visit meds:  Allergies as of 12/26/2021   No Known Allergies      Medication List     TAKE these medications    acetaminophen 325 MG tablet Commonly known as: TYLENOL Take 650 mg by mouth daily as needed for headache, moderate pain or mild pain. What changed: Another medication with the same name was added.  Make sure you understand how and when to take each.   acetaminophen 325 MG tablet Commonly known as: Tylenol Take 2 tablets (650 mg total) by mouth every 4 (four) hours as needed (for pain scale < 4). What changed: You were already taking a medication with the same name, and this prescription was added. Make sure you understand how and when to take each.   calcium carbonate 500 MG chewable tablet Commonly known as: TUMS - dosed in mg elemental calcium Chew 500-1,000 mg by mouth 2 (two) times daily as needed for heartburn.   ibuprofen 600 MG tablet Commonly known as: ADVIL Take 1 tablet (600 mg total) by mouth every 6 (six) hours.   PRENATAL PO Take 1 tablet by mouth daily. What changed: Another medication with the same name was removed. Continue taking this  medication, and follow the directions you see here.         Discharge home in stable condition Infant Feeding: Breast Infant Disposition:home with mother Discharge instruction: per After Visit Summary and Postpartum booklet. Activity: Advance as tolerated. Pelvic rest for 6 weeks.  Diet: routine diet Future Appointments: Future Appointments  Date Time Provider [place]  02/04/2022  8:35 AM Gabriel Carina, CNM [facility]   Follow up Visit: No message sent, PP visit already scheduled.  Additional Postpartum F/U: None   Low risk pregnancy complicated by:  None Delivery mode:  Vaginal, Spontaneous  Anticipated Birth Control:  Nexplanon   Shantonette Isaias Sakai) Rollene Rotunda, MSN, [place] for [place]  12/26/21  3:54 PM   I confirm that I have verified and agree with the information documented in the resident's note.   Shelda Pal, DO 12/26/2021 3:54 PM

## 2021-12-25 NOTE — H&P (Signed)
OBSTETRIC ADMISSION HISTORY AND PHYSICAL  Brittany Hunter is a 31 y.o. female G2P1001 with IUP at [redacted]w[redacted]d by LMP presenting for eIOL, fetus noted to have ventriculomegaly early, resolved on last [place]. Contractions have been intensifying and more frequent today. She reports +FMs, No LOF, no VB, no blurry vision, headaches or peripheral edema, and RUQ pain.  She plans on breast feeding. She request nexplanon for birth control. She received her prenatal [facility] at [facility]   Dating: By LMP --->  Estimated Date of Delivery: 12/25/21  Sono:    @[redacted]w[redacted]d , CWD, normal anatomy, cephalice presentation, 2531g, 29% EFW   Prenatal History/Complications:  Anxiety/depression on no meds  Past Medical History: Past Medical History:  Diagnosis Date   Anxiety    Chronic back pain    Depression     Past Surgical History: Past Surgical History:  Procedure Laterality Date   FOOT SURGERY Right    WISDOM TOOTH EXTRACTION      Obstetrical History: OB History     Gravida  2   Para  1   Term  1   Preterm  0   AB      Living  1      SAB      IAB      Ectopic      Multiple      Live Births  1           Social History Social History   Socioeconomic History   Marital status: Married    Spouse name: Not on file   Number of children: Not on file   Years of education: Not on file   Highest education level: Not on file  Occupational History   Not on file  Tobacco Use   Smoking status: Former    Types: Cigarettes   Smokeless tobacco: Never  Vaping Use   Vaping Use: Never used  Substance and Sexual Activity   Alcohol use: Not Currently    Comment: stopped Beer when found out pregnant   Drug use: Not Currently    Types: Marijuana   Sexual activity: Yes    Birth control/protection: None  Other Topics Concern   Not on file  Social History Narrative   Not on file   Social Determinants of Health   Financial Resource Strain: Not on file  Food Insecurity: No Food Insecurity  (12/25/2021)   Hunger Vital Sign    Worried About Running Out of Food in the Last Year: Never true    Ran Out of Food in the Last Year: Never true  Transportation Needs: No Transportation Needs (12/25/2021)   PRAPARE - 12/27/2021 (Medical): No    Lack of Transportation (Non-Medical): No  Physical Activity: Not on file  Stress: Not on file  Social Connections: Not on file    Family History: Family History  Problem Relation Age of Onset   Hypertension Mother    Diabetes Mother    Hypertension Father    Diabetes Maternal Grandmother    Diabetes Paternal Grandmother     Allergies: No Known Allergies  Medications Prior to Admission  Medication Sig Dispense Refill Last Dose   acetaminophen (TYLENOL) 325 MG tablet Take 650 mg by mouth every 6 (six) hours as needed.      cetirizine (ZYRTEC) 10 MG tablet Take 10 mg by mouth daily.      Prenatal Vit-Fe Fumarate-FA (MULTIVITAMIN-PRENATAL) 27-0.8 MG TABS tablet Take 1 tablet by mouth daily at 12  noon.        Review of Systems   All systems reviewed and negative except as stated in HPI  Last menstrual period 03/20/2021. General appearance: alert, cooperative, and no distress Lungs: no respiratory distress Heart: regular rate and rhythm Abdomen: soft, non-tender; bowel sounds normal Pelvic: deferred, waiting on IV Extremities: no LE edema, no sign of DVT Presentation: cephalic- confirmed via bedside US Fetal monitoringBaseline: 130 bpm, Variability: Fair (1-6 bpm), Accelerations: Reactive, and Decelerations: Absent Uterine activityFrequency: Every 5 minutes     Prenatal labs: ABO, Rh: O/Positive/-- (04/04 1158) Antibody: Negative (04/04 1158) Rubella: 2.51 (04/04 1158) RPR: Non Reactive (07/26 0936)  HBsAg: Negative (04/04 1158)  HIV: Non Reactive (07/26 0936)  GBS: Positive/-- (10/04 1441)  2 hr Glucola passed Anatomy US- normal, initially unilateral ventriculomegaly but resolved on later  [place]   Nursing Staff Provider  Office Location  [facility] Dating  LMP  [facility] Model [ ]  Traditional [x ] Centering [ ]  Mom-Baby Dyad    Language  English Anatomy US  WNL but limited--f/u 4 wks  Flu Vaccine  declined Genetic/Carrier Screen  NIPS: low risk female   AFP: Negative Horizon: neg x 4  TDaP Vaccine    Hgb A1C or  GTT Early 5.1 Third trimester   COVID Vaccine Pfizer-2 doses   LAB RESULTS   Rhogam  n/a Blood Type O/Positive/-- (04/04 1158)   Baby Feeding Plan Breast Antibody Negative (04/04 1158)  Contraception Nexplanon Rubella 2.51 (04/04 1158)  Circumcision No RPR Non Reactive (04/04 1158)   Pediatrician  [facility] HBsAg Negative (04/04 1158)   Support Person John(FOB) HCVAb negative  Prenatal Classes  HIV Non Reactive (04/04 1158)     BTL Consent NA GBS   (For PCN allergy, check sensitivities)   VBAC Consent NA Pap        DME Rx Valu.Nieves ] BP cuff [ ]  Weight Scale Waterbirth  [ ]  Class [ ]  Consent [ ]  CNM visit  PHQ9 & GAD7 [  ] new OB [  ] 28 weeks  [  ] 36 weeks Induction  [ ]  Orders Entered [ ] Foley Y/N    Prenatal Transfer Tool  Maternal Diabetes: No Genetic Screening: Normal Maternal Ultrasounds/Referrals: Normal Fetal Ultrasounds or other Referrals:  None Maternal Substance Abuse:  No Significant Maternal Medications:  None Significant Maternal Lab Results:  Group B Strep positive Number of Prenatal Visits:greater than 3 verified prenatal visits Other Comments:  None  No results found for this or any previous visit (from the past 24 hour(s)).  Patient Active Problem List   Diagnosis Date Noted   Encounter for elective induction of labor 12/25/2021   Anxiety 06/12/2021   Depression 06/12/2021   Chronic back pain 06/12/2021   Supervision of low-risk pregnancy 06/03/2021    Assessment/Plan:  Brittany Hunter is a 31 y.o. G2P1001 at [redacted]w[redacted]d here for [place].   #Labor: latent labor. Will likely start with cytotec after IV placed and initial exam done.  Plan for AROM augmentation prn after adequate antibiotics.  #Pain: Moderate, does not want epidural. Ok with IV fentanyl. Encouraged walking/sitting on ball.  #FWB: Category 1 #ID:  GBS + on PCN #MOF: breast #MOC:nexplanon #Circ:  no  Cecilio Asper, MD  12/25/2021, 7:39 AM

## 2021-12-25 NOTE — Lactation Note (Signed)
This note was copied from a baby's chart. Lactation Consultation Note  Patient Name: Boy Brayleigh Rybacki QASTM'H Date: 12/25/2021 Reason for consult: L&D Initial assessment;Term Age:30 hours Initially Birth Parent had BF infant on her right breast prior to [facility] entering the room for 10 minutes, Birth Parent switched to left breast using the cross cradle hold, infant latched with depth and swallows observed. LC gave Birth Parent a pillow for BF support and help align and position infant parallel to Birth Parent's breast, infant was still breastfeeding after 17 minutes when Quanah left the room. Birth Parent will continue to BF infant according to hunger cues, on demand, 8 to 12+ times within 24 hours, STS. Birth Parent know to call [place] for further latch assistance if needed.   Maternal Data Does the patient have breastfeeding experience prior to this delivery?: Yes How long did the patient breastfeed?: Per Birth Parent , she BF her 1st child for 8 months  Feeding Mother's Current Feeding Choice: Breast Milk and Formula  LATCH Score Latch: Grasps breast easily, tongue down, lips flanged, rhythmical sucking. (After few minutes infant started sustaining his latch.)  Audible Swallowing: A few with stimulation  Type of Nipple: Everted at rest and after stimulation  Comfort (Breast/Nipple): Soft / non-tender  Hold (Positioning): Assistance needed to correctly position infant at breast and maintain latch.  LATCH Score: 8   Lactation Tools Discussed/Used    Interventions Interventions: Assisted with latch;Skin to skin;Position options;Support pillows;Adjust position  Discharge    Consult Status Consult Status: Follow-up from L&D    Vicente Serene 12/25/2021, 4:29 PM

## 2021-12-25 NOTE — Progress Notes (Signed)
Patient ID: Brittany Hunter, female   DOB: 07-29-1990, 31 y.o.   MRN: [id number]  Labor Progress Note Brittany Hunter is a 31 y.o. G2P1001 at [redacted]w[redacted]d presented for eIOL  S:  Pt more uncomfortable and contracting regularly, tolerating pain well and ambulating with FOB at bedside for support  O:  BP 114/68   Pulse 83   Temp 98 F (36.7 C) (Oral)   LMP 03/20/2021 (Exact Date)  EFM: baseline 130 bpm/ moderate variability/ 15x15 accels/ no decels  Toco/IUPC: q4-30min SVE: Dilation: 9 Effacement (%): 90 Station: Plus 1 Presentation: Vertex Exam by:: Ronalee Belts RN Pitocin: None  A/P: 31 y.o. G2P1001 [redacted]w[redacted]d  1. Labor: Active labor 2. FWB: Cat 1 3. Pain: IV pain meds as needed 4. GBS+: IV in, PCN changed to ampicillin  Expectant management. Anticipate SVD.  Gaylan Gerold, CNM, MSN, [place] Certified Nurse Midwife, [facility]

## 2021-12-26 DIAGNOSIS — Z30017 Encounter for initial prescription of implantable subdermal contraceptive: Secondary | ICD-10-CM

## 2021-12-26 MED ORDER — ACETAMINOPHEN 325 MG PO TABS: 650.0000 mg | ORAL_TABLET | ORAL | 0 refills | Status: AC | PRN

## 2021-12-26 MED ORDER — ETONOGESTREL 68 MG ~~LOC~~ IMPL
68.0000 mg | DRUG_IMPLANT | Freq: Once | SUBCUTANEOUS | Status: AC
  Administered 2021-12-26: 68 mg via SUBCUTANEOUS
  Filled 2021-12-26: qty 1

## 2021-12-26 MED ORDER — LIDOCAINE HCL 1 % IJ SOLN
0.0000 mL | Freq: Once | INTRAMUSCULAR | Status: AC | PRN
  Administered 2021-12-26: 20 mL via INTRADERMAL
  Filled 2021-12-26: qty 20

## 2021-12-26 MED ORDER — IBUPROFEN 600 MG PO TABS: 600.0000 mg | ORAL_TABLET | Freq: Four times a day (QID) | ORAL | 0 refills | Status: DC

## 2021-12-26 NOTE — Social Work (Signed)
MOB was referred for history of depression/anxiety.  * Referral screened out by Clinical Social Worker because none of the following criteria appear to apply:  ~ History of anxiety/depression during this pregnancy, or of post-partum depression following prior delivery.  ~ Diagnosis of anxiety and/or depression within last 3 years OR * MOB's symptoms currently being treated with medication and/or therapy. Per chart review "Well managed with therapy".  Please contact the Clinical Social Worker if needs arise or by MOB request.  Letta Kocher, [place] Social Worker [phone]

## 2021-12-26 NOTE — Lactation Note (Signed)
This note was copied from a baby's chart. Lactation Consultation Note  Patient Name: Brittany Hunter Date: 12/26/2021 Reason for consult: Initial assessment;Term;Infant weight loss (3 % weight loss, Dyad has a D/C . LC reviewed the doc flow sheets WNL for age.  LC reviewed BF basics, BF D/C teaching, and Z[place] resources. LC encouraged mom to call for Latch assessment if needed.) Age:30 hours  Maternal Data    Feeding Mother's Current Feeding Choice: Breast Milk  LATCH Score ) Latch score by the [facility] - the previous LS =8  Latch: Repeated attempts needed to sustain latch, nipple held in mouth throughout feeding, stimulation needed to elicit sucking reflex.  Audible Swallowing: A few with stimulation  Type of Nipple: Everted at rest and after stimulation  Comfort (Breast/Nipple): Soft / non-tender  Hold (Positioning): Assistance needed to correctly position infant at breast and maintain latch.  LATCH Score: 7   Lactation Tools Discussed/Used    Interventions Interventions: Breast feeding basics reviewed;Education;LC Services brochure  Discharge Discharge Education: Engorgement and breast care;Warning signs for feeding baby Pump: DEBP;Manual;Personal  Consult Status Consult Status: Complete Date: 12/26/21    Myer Haff 12/26/2021, 2:56 PM

## 2021-12-26 NOTE — Procedures (Signed)
Nexplanon Insertion Procedure Patient identified, informed consent performed, consent signed.   Patient does understand that irregular bleeding is a very common side effect of this medication. She was advised to have backup contraception for one week after placement. Patient is currently postpartum in the hospital. Appropriate time out taken.  Patient's left arm was prepped and draped in the usual sterile fashion. The ruler used to measure and mark insertion area.  Patient was prepped with alcohol swab and then injected with 3 ml of 1% lidocaine.  She was prepped with betadine, Nexplanon removed from packaging,  Device confirmed in needle, then inserted full length of needle and withdrawn per handbook instructions. Nexplanon was able to palpated in the patient's arm; patient palpated the insert herself. There was minimal blood loss.  Patient insertion site covered with guaze and a pressure bandage to reduce any bruising.  The patient tolerated the procedure well and was given post procedure instructions.   SN [id number] EXP [id number] LOT [id number]

## 2022-01-05 ENCOUNTER — Telehealth (HOSPITAL_COMMUNITY): Payer: Self-pay | Admitting: *Deleted

## 2022-01-05 ENCOUNTER — Encounter: Payer: Self-pay | Admitting: General Practice

## 2022-01-05 NOTE — Telephone Encounter (Signed)
Mom reports feeling good. No concerns about herself at this time. EPDS=2 [facility] score=3) Mom reports baby is doing well. Feeding, peeing, and pooping without difficulty. Safe sleep reviewed. Mom reports no concerns about baby at present.  Odis Hollingshead, RN 01-05-2022 at 1:44pm

## 2022-02-04 ENCOUNTER — Ambulatory Visit (INDEPENDENT_AMBULATORY_CARE_PROVIDER_SITE_OTHER): Payer: No Typology Code available for payment source | Admitting: Certified Nurse Midwife

## 2022-02-04 NOTE — Progress Notes (Signed)
Post Partum Visit Note  Brittany Hunter is a 31 y.o. G33P2002 female who presents for a postpartum visit. She is 6 weeks postpartum following a normal spontaneous vaginal delivery.  I have fully reviewed the prenatal and intrapartum course. The delivery was at 40 gestational weeks.  Anesthesia: none. Postpartum course has been uncomplicated. Baby is doing well. Baby is feeding by breastfeeding and bottled-breastmilk. Bleeding thin lochia and changing a thin pad 3 times a day. Bowel function is normal. Bladder function is normal. Patient is not sexually active. Contraception method is Nexplanon. Postpartum depression screening: negative.   Upstream - 02/04/22 2000       Pregnancy Intention Screening   Does the patient want to become pregnant in the next year? No    Does the patient's partner want to become pregnant in the next year? No    Would the patient like to discuss contraceptive options today? N/A   Has Nexplanon in place     Contraception Wrap Up   Current Method Hormonal Implant    End Method Hormonal Implant    Contraception Counseling Provided No    How was the end contraceptive method provided? N/A            The pregnancy intention screening data noted above was reviewed. Potential methods of contraception were discussed. The patient elected to proceed with Hormonal Implant.   Edinburgh Postnatal Depression Scale - 02/04/22 0843       Edinburgh Postnatal Depression Scale:  In the Past 7 Days   I have been able to laugh and see the funny side of things. 0    I have looked forward with enjoyment to things. 0    I have blamed myself unnecessarily when things went wrong. 2    I have been anxious or worried for no good reason. 1    I have felt scared or panicky for no good reason. 2    Things have been getting on top of me. 2    I have been so unhappy that I have had difficulty sleeping. 0    I have felt sad or miserable. 0    I have been so unhappy that I have  been crying. 1    The thought of harming myself has occurred to me. 0    Edinburgh Postnatal Depression Scale Total 8            Health Maintenance Due  Topic Date Due   HPV VACCINES (2 - 3-dose series) 04/20/2017   COVID-19 Vaccine (5 - 2023-24 season) 10/31/2021   The following portions of the patient's history were reviewed and updated as appropriate: allergies, current medications, past family history, past medical history, past social history, past surgical history, and problem list.  Review of Systems Pertinent items noted in HPI and remainder of comprehensive ROS otherwise negative.  Objective:  BP 111/76   Pulse 69   Ht 5\' 5"  (1.651 m)   Wt 217 lb 14.4 oz (98.8 kg)   LMP 02/01/2022 (Exact Date)   Breastfeeding Yes   BMI 36.26 kg/m    Constitutional: Alert, oriented female in no physical distress.  HEENT: PERRLA Skin: normal color and turgor, no rash Cardiovascular: normal rate & rhythm, warm and well perfused Respiratory: normal effort, no problems with respiration noted GI: Abd soft, non-tender MS: Extremities nontender, no edema, normal ROM Neurologic: Alert and oriented x 4.  GU: no CVA tenderness Pelvic: exam not indicated, pt healing well Breasts: normal lactating  breasts  Assessment:  1. Postpartum care and examination - Doing well overall. Edinburgh borderline - talked about normal adjustments to parenthood, how to navigate spousal disagreements and division of labor, normal cue-based feedings during growth spurts and affirmed how challenging this phase can be. - Pt aware of community resources and will reach out if she desires medication for anxiety.  Plan:   Essential components of care per ACOG recommendations:  1.  Mood and well being: Patient with negative depression screening today. Reviewed local resources for support.  - Patient tobacco use? No.   - hx of drug use? No.    2. Infant care and feeding:  -Patient currently breastmilk feeding?  Yes. Reviewed importance of draining breast regularly to support lactation.  -Social determinants of health (SDOH) reviewed in EPIC. No concerns  3. Sexuality, contraception and birth spacing - Patient does not want a pregnancy in the next year.  Desired family size is 2 children.  - Reviewed reproductive life planning. Reviewed contraceptive methods based on pt preferences and effectiveness.  Patient desired Hormonal Implant today.   - Discussed birth spacing of 18 months  4. Sleep and fatigue -Encouraged family/partner/community support of 4 hrs of uninterrupted sleep to help with mood and fatigue  5. Physical Recovery  - Discussed patients delivery and complications. She describes her labor as good. - Patient had a Vaginal, no problems at delivery. Patient had  no  laceration. Perineal healing reviewed. Patient expressed understanding - Patient has urinary incontinence? No. - Patient is safe to resume physical and sexual activity  6.  Health Maintenance - HM due items addressed Yes - Last pap smear 03/14/21, NILM. Pap smear not done at today's visit.  -Breast Cancer screening indicated? No.   7. Chronic Disease/Pregnancy Condition follow up: None - PCP follow up as needed  Bernerd Limbo, CNM [facility] [employer], [facility]

## 2022-02-04 NOTE — Patient Instructions (Signed)
Midwife Jam's Lactation Bars Preheat oven to 350 degrees and grease a 9x13 pan Combine your fats and sugars: - 1c salted butter - 1/2-3/4c peanut butter - 2 eggs - 2tbsp flaxmeal in 4tbsp water - 1tsp vanilla - 1c white sugar - 1c brown sugar Mix the following dry ingredients in a separate bowl: - 2c flour - 1tsp baking soda - 1tsp salt -2-4tbsp Brewer's yeast -1/4 - 1/2c hemp hearts Slowly add the dry into the wet mix. Once well combined, stir in the following: - 3c rolled oats  - 1c (or more) chocolate chips  Bake for .  Can sub almond butter and add 1tsp cinnamon and raisins or dried cranberries.

## 2023-12-12 IMAGING — US US MFM OB DETAIL+14 WK
1 series · 13 of 28 positions shown · non-contrast
Comparison: none

[Series 1: us mfm ob detail+14 wk · 13 of 144 slices shown]
[im 6/144]
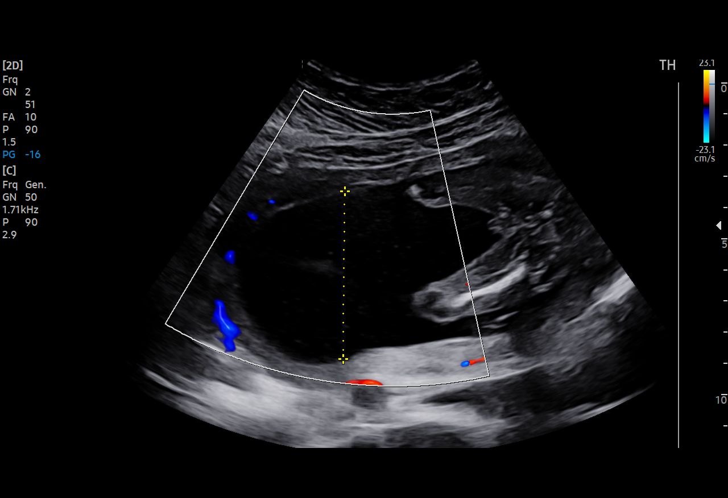
[im 16/144]
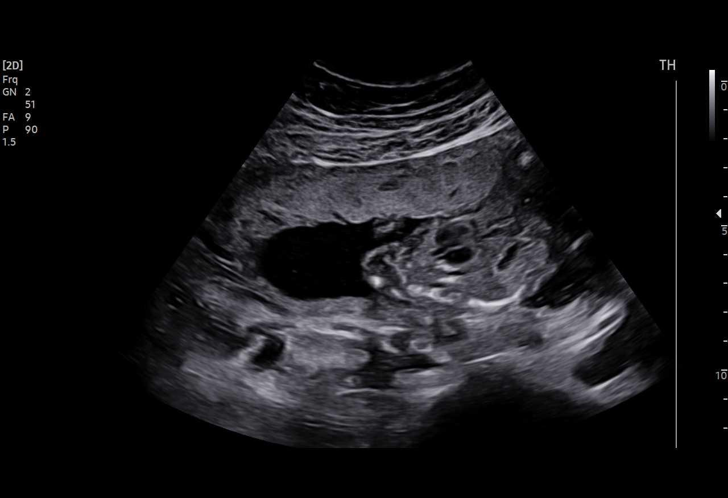
[im 27/144]
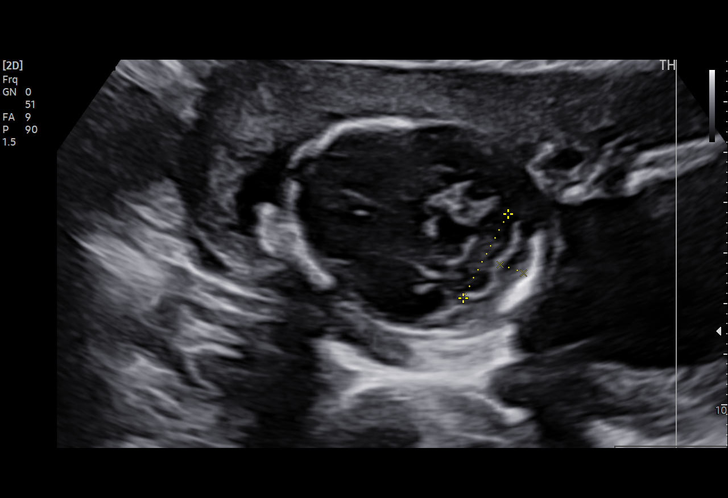
[im 38/144]
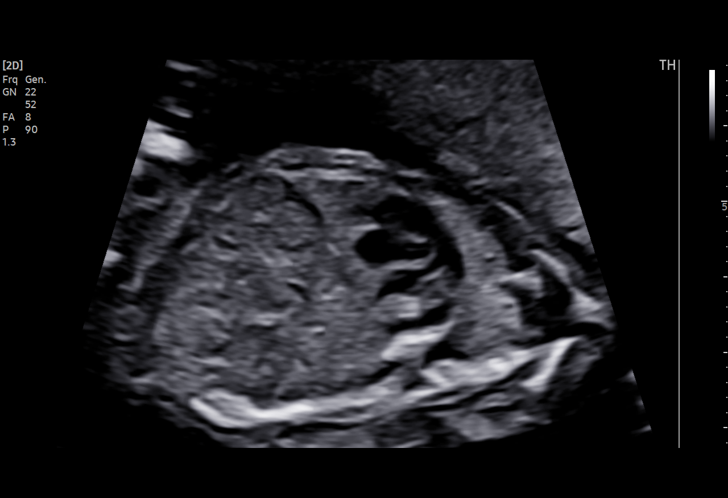
[im 48/144]
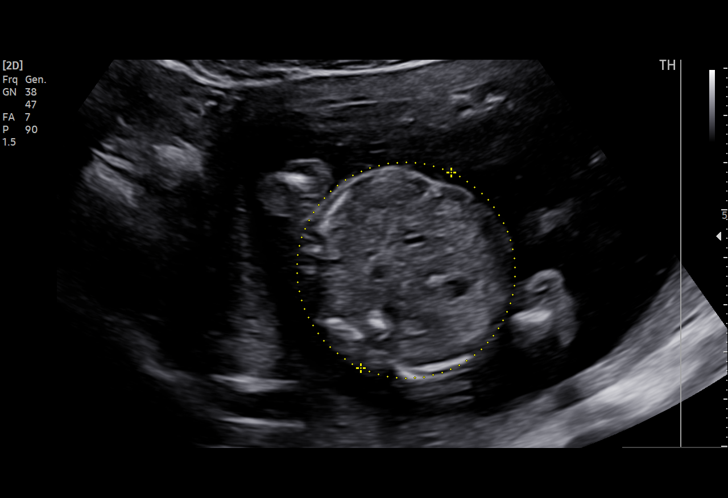
[im 59/144]
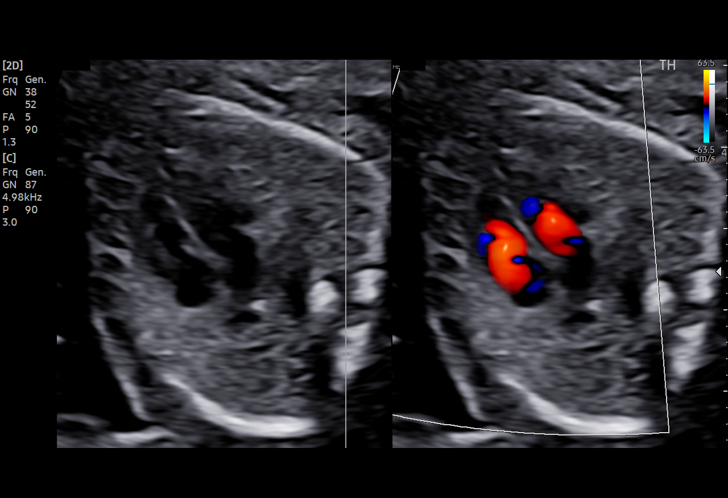
[im 75/144]
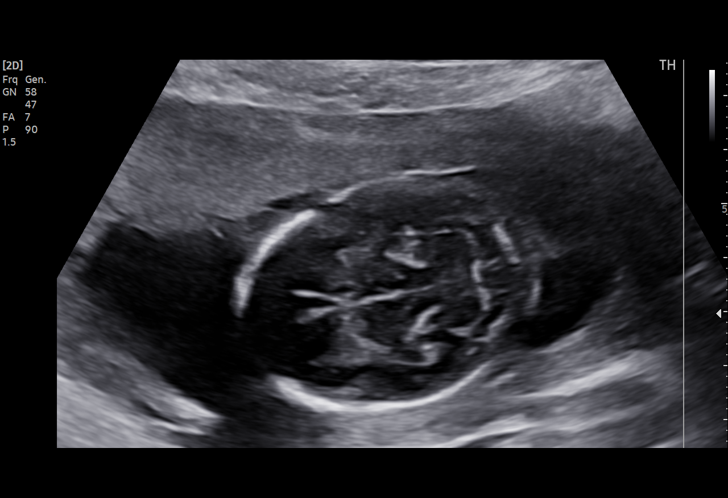
[im 85/144]
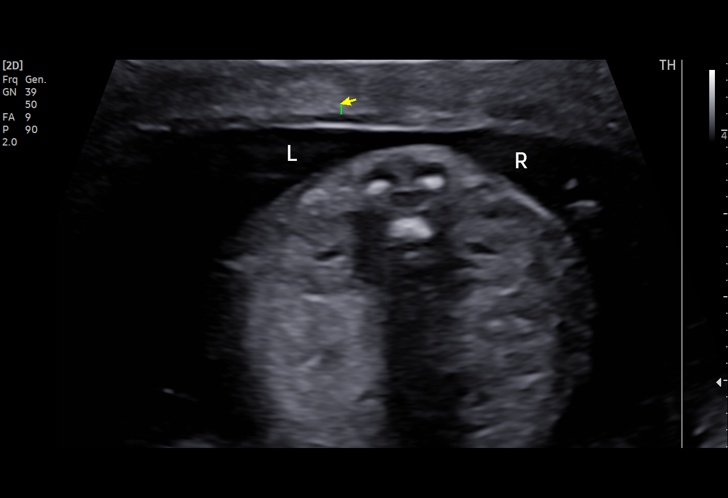
[im 96/144]
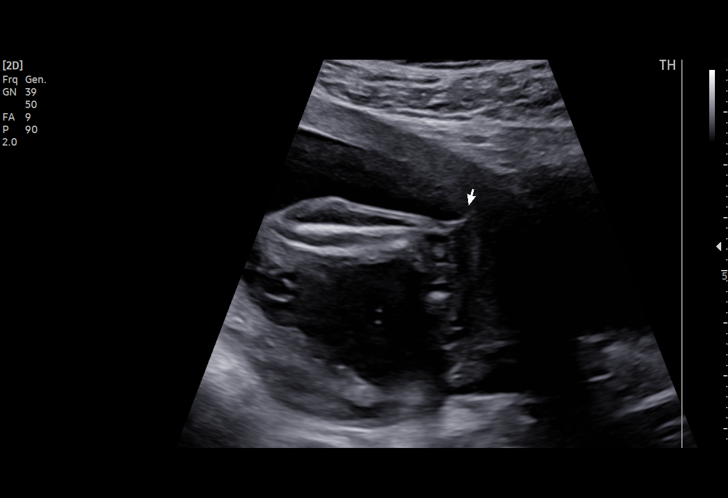
[im 106/144]
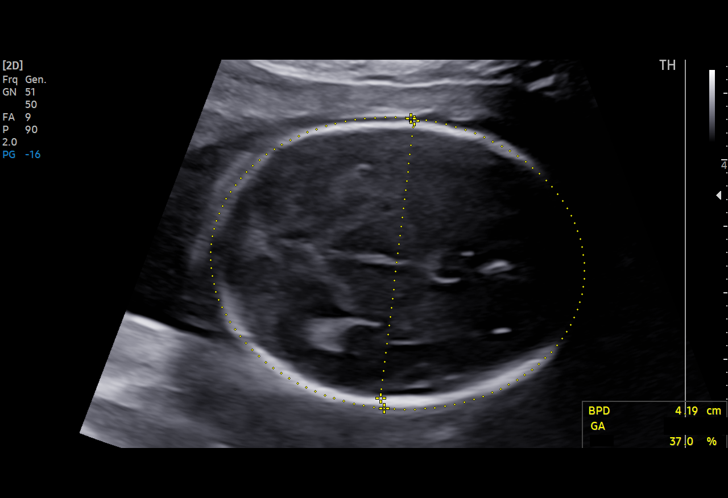
[im 117/144]
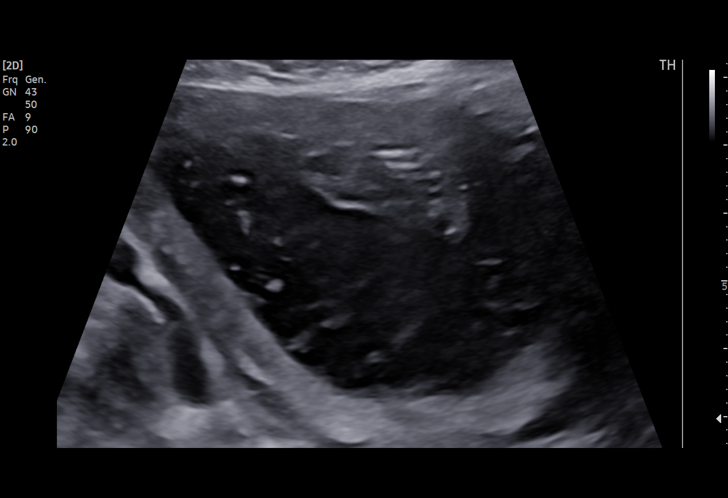
[im 128/144]
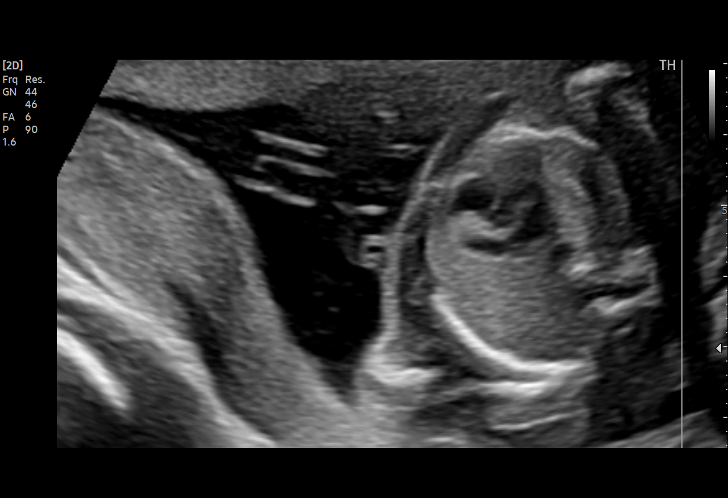
[im 138/144]
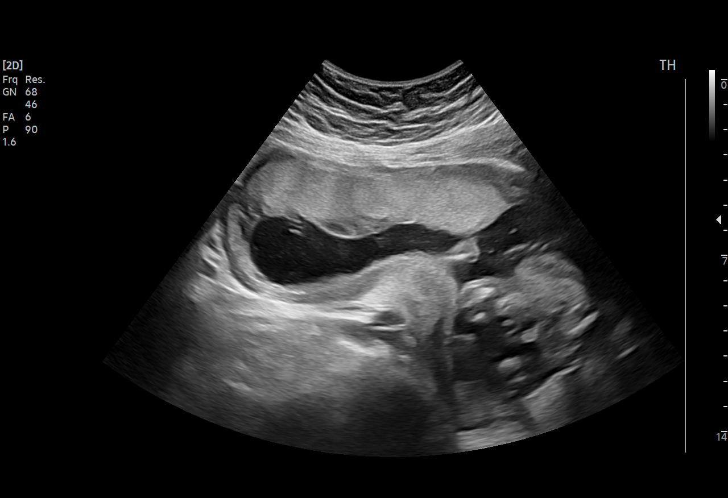

[13 of 28 positions shown; findings below may reference images not displayed]

Indications

 Obesity complicating pregnancy, second
 trimester
 19 weeks gestation of pregnancy
 Encounter for antenatal screening for
 malformations
Vital Signs

                           Pulse:  72
 BP:          108/65
Fetal Evaluation

 Num Of Fetuses:         1
 Fetal Heart Rate(bpm):  130
 Cardiac Activity:       Observed
 Presentation:           Breech
 Placenta:               Anterior
 P. Cord Insertion:      Visualized, central

 Amniotic Fluid
 AFI FV:      Within normal limits

                             Largest Pocket(cm)

Biometry
 BPD:      41.9  mm     G. Age:  18w 5d         37  %    CI:        73.08   %    70 - 86
                                                         FL/HC:      18.2   %    16.1 -
 HC:      155.8  mm     G. Age:  18w 4d         20  %    HC/AC:      1.19        1.09 -
 AC:      130.4  mm     G. Age:  18w 4d         31  %    FL/BPD:     67.5   %
 FL:       28.3  mm     G. Age:  18w 5d         31  %    FL/AC:      21.7   %    20 - 24
 CER:      19.4  mm     G. Age:  18w 6d         30  %
 NFT:       4.9  mm

 LV:        5.7  mm
 CM:        4.8  mm

 Est. FW:     249  gm      0 lb 9 oz     25  %
OB History

 Gravidity:    2         Term:   1
 Living:       1
Gestational Age

 LMP:           19w 0d        Date:  03/20/21                 EDD:   12/25/21
 U/S Today:     18w 5d                                        EDD:   12/27/21
 Best:          19w 0d     Det. By:  LMP  (03/20/21)          EDD:   12/25/21
Anatomy

 Cranium:               Appears normal         LVOT:                   Not well visualized
 Cavum:                 Appears normal         Aortic Arch:            Appears normal
 Ventricles:            Appears normal         Ductal Arch:            Appears normal
 Choroid Plexus:        Appears normal         Diaphragm:              Appears normal
 Cerebellum:            Appears normal         Stomach:                Appears normal, left
                                                                       sided
 Posterior Fossa:       Appears normal         Abdomen:                Appears normal
 Nuchal Fold:           Appears normal         Abdominal Wall:         Appears nml (cord
                                                                       insert, abd wall)
 Face:                  Orbits nl; profile not Cord Vessels:           Appears normal (3
                        well visualized                                vessel cord)
 Lips:                  Appears normal         Kidneys:                Appear normal
 Palate:                Not well visualized    Bladder:                Appears normal
 Thoracic:              Appears normal         Spine:                  Appears normal
 Heart:                 Appears normal         Upper Extremities:      Appears normal
                        (4CH, axis, and
                        situs)
 RVOT:                  Appears normal         Lower Extremities:      Appears normal

 Other:  VC, 3VV and 3VTV visualized. Heels/feet and open hands/5th digits
         visualized. Fetus appears to be a male.
Cervix Uterus Adnexa

 Cervix
 Length:            4.7  cm.
 Not adaquately visualized. LUS contraction.

 Uterus
 No abnormality visualized.
 Right Ovary
 Within normal limits.

 Left Ovary
 Not visualized.

 Cul De Sac
 No free fluid seen.

 Adnexa
 No abnormality visualized.
Comments

 This patient was seen for a detailed fetal anatomy scan.
 She denies any significant past medical history and denies
 any problems in her current pregnancy.
 She had a cell free DNA test earlier in her pregnancy which
 indicated a low risk for trisomy 21, 18, and 13. A male fetus is
 predicted.
 She was informed that the fetal growth and amniotic fluid
 level were appropriate for her gestational age.
 There were no obvious fetal anomalies noted on today's
 ultrasound exam.  However, the views of the fetal anatomy
 were limited today due to the fetal position.
 The patient was informed that anomalies may be missed due
 to technical limitations. If the fetus is in a suboptimal position
 or maternal habitus is increased, visualization of the fetus in
 the maternal uterus may be impaired.
 A follow-up exam was scheduled in 4 weeks to complete the
 views of the fetal anatomy.
